# Patient Record
Sex: Female | Born: 1940 | Race: Black or African American | Hispanic: No | Marital: Married | Smoking: Never smoker
Health system: Southern US, Community
[De-identification: ages and names within clinical notes are randomized; demographics above are authoritative.]

## PROBLEM LIST (undated history)

## (undated) DIAGNOSIS — I639 Cerebral infarction, unspecified: Secondary | ICD-10-CM

## (undated) DIAGNOSIS — I4891 Unspecified atrial fibrillation: Secondary | ICD-10-CM

---

## 2005-05-05 ENCOUNTER — Other Ambulatory Visit: Admission: RE | Admit: 2005-05-05 | Discharge: 2005-05-05 | Payer: Self-pay | Admitting: Family Medicine

## 2014-09-10 DIAGNOSIS — Z961 Presence of intraocular lens: Secondary | ICD-10-CM | POA: Diagnosis not present

## 2018-07-31 ENCOUNTER — Ambulatory Visit (HOSPITAL_COMMUNITY)
Admission: EM | Admit: 2018-07-31 | Discharge: 2018-07-31 | Disposition: A | Discharge status: Home / Self Care | Payer: Medicare Other | Attending: Urgent Care | Admitting: Urgent Care

## 2018-07-31 ENCOUNTER — Ambulatory Visit (INDEPENDENT_AMBULATORY_CARE_PROVIDER_SITE_OTHER): Payer: Medicare Other

## 2018-07-31 ENCOUNTER — Other Ambulatory Visit: Payer: Self-pay

## 2018-07-31 ENCOUNTER — Encounter (HOSPITAL_COMMUNITY): Payer: Self-pay | Admitting: *Deleted

## 2018-07-31 DIAGNOSIS — M79651 Pain in right thigh: Secondary | ICD-10-CM

## 2018-07-31 DIAGNOSIS — M79604 Pain in right leg: Secondary | ICD-10-CM | POA: Diagnosis not present

## 2018-07-31 DIAGNOSIS — M545 Low back pain, unspecified: Secondary | ICD-10-CM

## 2018-07-31 DIAGNOSIS — R030 Elevated blood-pressure reading, without diagnosis of hypertension: Secondary | ICD-10-CM

## 2018-07-31 DIAGNOSIS — D259 Leiomyoma of uterus, unspecified: Secondary | ICD-10-CM

## 2018-07-31 DIAGNOSIS — M4317 Spondylolisthesis, lumbosacral region: Secondary | ICD-10-CM

## 2018-07-31 DIAGNOSIS — M5136 Other intervertebral disc degeneration, lumbar region: Secondary | ICD-10-CM

## 2018-07-31 MED ORDER — VALACYCLOVIR HCL 1 G PO TABS: 1000.0000 mg | ORAL_TABLET | Freq: Three times a day (TID) | ORAL | 0 refills | Status: DC

## 2018-07-31 NOTE — ED Provider Notes (Signed)
MRN: [id number] DOB: Jun 08, 1940  Subjective:   Lydia Garcia is a 78 y.o. female presenting for 2-day history of moderate to severe sharp constant right thigh pain, numbness and tingling about her right hip and mild intermittent low back pain that goes to her right flank side.  Patient has a history of back issues and is working with PT and on orthopedist for this.  She states that she is gotten much better over the past year.  She has not tried any medications for relief.  Denies any recent falls, fever, nausea, vomiting, belly pain, blood in her urine, heavy lifting.  Denies any rash.  Denies ever having shingles. She does not take chronic medications.  Denies history of hypertension.  Reports that her blood pressure is elevated because of her thigh pain.  Tries to hydrate very well.  No Known Allergies  ROS  Objective:   Vitals: BP (!) 170/78   Pulse 77   Temp 98.3 F (36.8 C) (Oral)   Resp 16   SpO2 97%   Physical Exam Constitutional:      General: She is not in acute distress.    Appearance: Normal appearance. She is well-developed. She is not ill-appearing.  HENT:     Head: Normocephalic and atraumatic.     Nose: Nose normal.     Mouth/Throat:     Mouth: Mucous membranes are moist.     Pharynx: Oropharynx is clear.  Eyes:     General: No scleral icterus.    Extraocular Movements: Extraocular movements intact.     Pupils: Pupils are equal, round, and reactive to light.  Cardiovascular:     Rate and Rhythm: Normal rate.  Pulmonary:     Effort: Pulmonary effort is normal.  Musculoskeletal:     Lumbar back: She exhibits decreased range of motion. She exhibits no tenderness, no bony tenderness, no swelling, no edema, no deformity and no spasm.     Right upper leg: She exhibits tenderness (With light and deep palpation about her mid anterior lateral thigh). She exhibits no bony tenderness, no swelling, no edema, no deformity and no laceration.  Skin:    General: Skin is  warm and dry.     Findings: No rash.  Neurological:     General: No focal deficit present.     Mental Status: She is alert and oriented to person, place, and time.  Psychiatric:        Mood and Affect: Mood normal.        Behavior: Behavior normal.    Dg Lumbar Spine Complete  Result Date: 07/31/2018 CLINICAL DATA:  Two-day history of low back pain radiating into the RIGHT thigh making it difficult to bear weight with the RIGHT leg. EXAM: LUMBAR SPINE - COMPLETE 4+ VIEW COMPARISON:  None. FINDINGS: 5 non-rib-bearing lumbar vertebrae. Severe facet degenerative changes at L3-4, L4-5 and L5-S1, resulting in grade 1 spondylolisthesis of L5 on S1 measuring approximately 8 mm. Disc spaces well-preserved throughout. Sacroiliac joints intact with only mild degenerative changes. Note is made of numerous calcified, degenerated uterine fibroids in the pelvis. IMPRESSION: 1. Severe facet degenerative changes at L3-4, L4-5 and L5-S1, resulting in grade 1 spondylolisthesis of L5 on S1 measuring approximately 8 mm. 2. Well-preserved disc spaces throughout the lumbar spine. Electronically Signed   By: Evangeline Dakin M.D.   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  I counseled the she should establish care with an orthopedist.  She does not want to take medications with heavy side effects.  Therefore we will have her schedule Tylenol and stop ibuprofen.  She is to start Valtrex if she develops a rash consistent with shingles to address possible pre-herpetic neuralgia.  Recommended conservative management with her back, continued adequate daily hydration. Counseled patient on  potential for adverse effects with medications prescribed today, patient verbalized understanding. ER and return-to-clinic precautions discussed, patient verbalized understanding.    Jaynee Eagles, PA-C 07/31/18 1323

## 2018-07-31 NOTE — ED Triage Notes (Signed)
C/O waking 2 days ago with right anterior thigh pain and slight numbness in right hip, and slight twinge of pain in right lower back.  Denies any injury.

## 2018-07-31 NOTE — Discharge Instructions (Addendum)
You may take 500mg -650mg  Tylenol every 6 hours for pain and inflammation of your back and thigh.

## 2018-07-31 NOTE — ED Notes (Signed)
Patient verbalizes understanding of discharge instructions. Opportunity for questioning and answers were provided. Patient discharged from [facility] by provider.  

## 2020-01-10 IMAGING — DX LUMBAR SPINE - COMPLETE 4+ VIEW
5 series · 5 of 5 positions shown · non-contrast
Comparison: None.

CLINICAL DATA: Two-day history of low back pain radiating into the
RIGHT thigh making it difficult to bear weight with the RIGHT leg.

EXAM:
LUMBAR SPINE - COMPLETE 4+ VIEW

[l-spine ap]
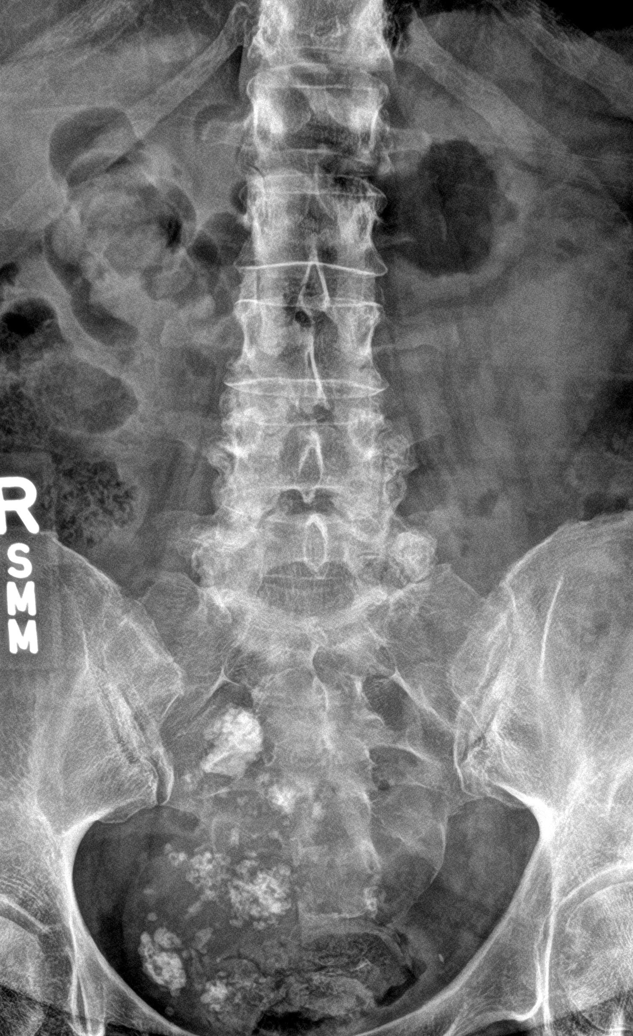

[l-spine obl (1 of 2)]
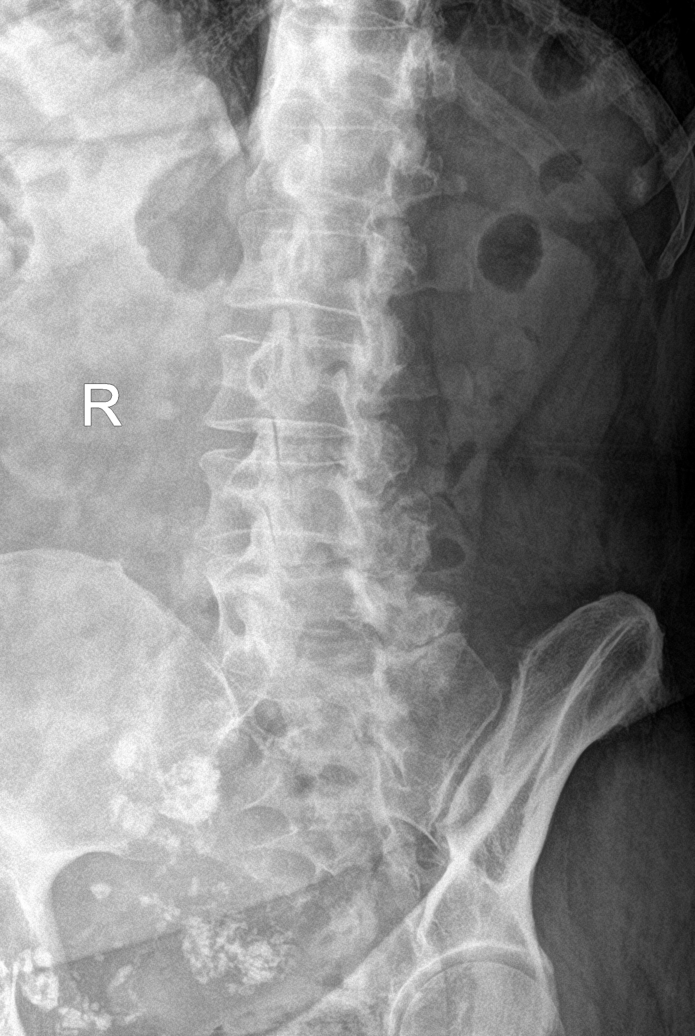

[l-spine obl (2 of 2)]
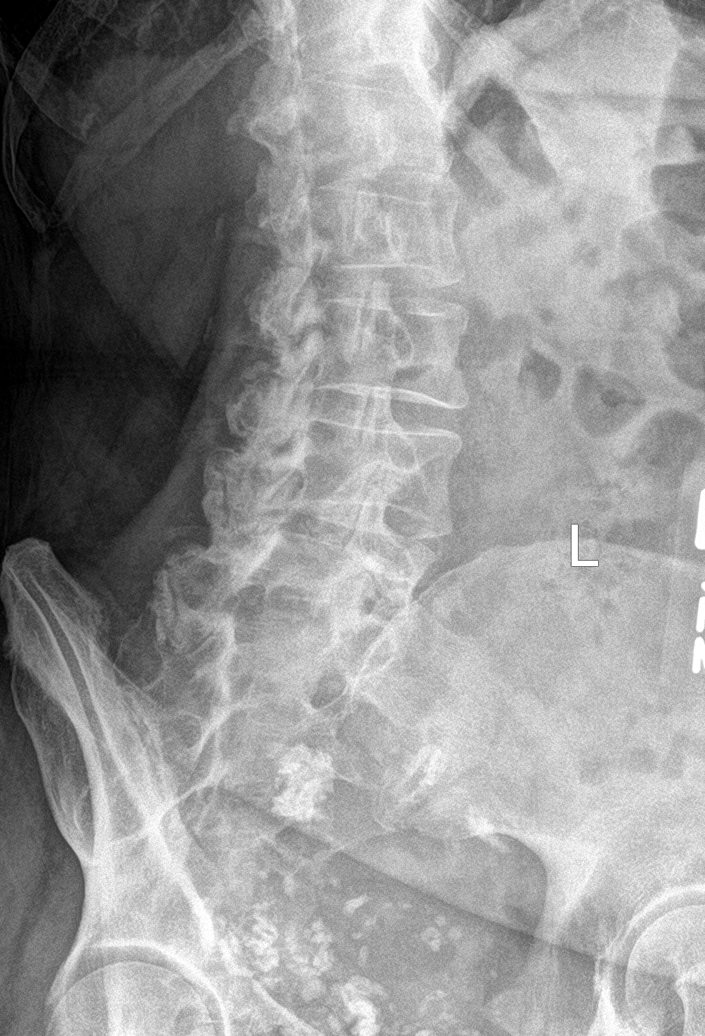

[l-spine lat]
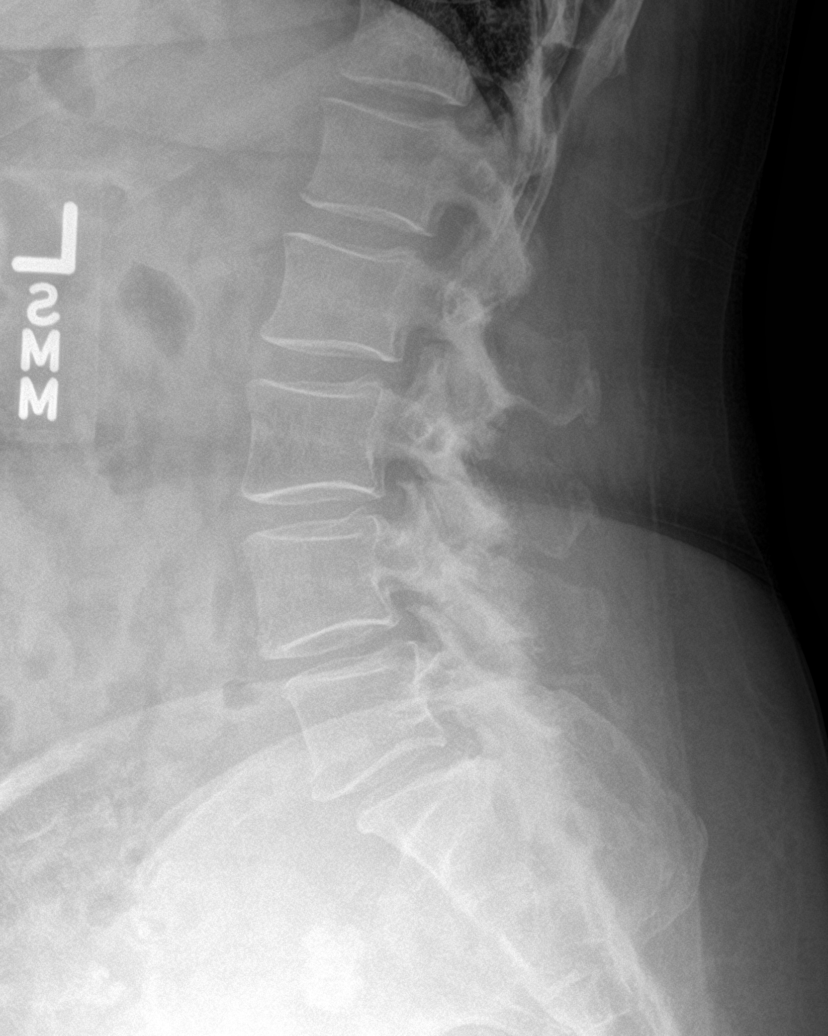

[l-spine spot]
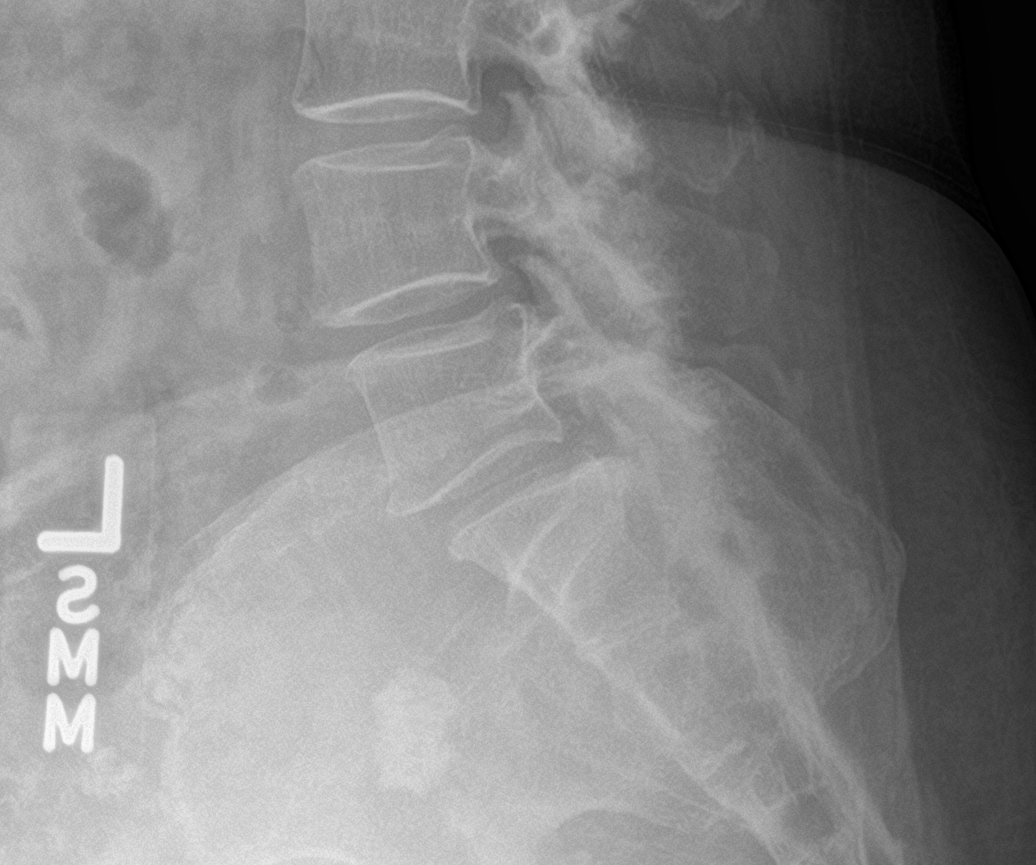

[5 of 5 positions shown; findings below may reference images not displayed]

FINDINGS: 5 non-rib-bearing lumbar vertebrae. Severe facet degenerative
changes at L3-4, L4-5 and L5-S1, resulting in grade 1
spondylolisthesis of L5 on S1 measuring approximately 8 mm. Disc
spaces well-preserved throughout. Sacroiliac joints intact with only
mild degenerative changes.

Note is made of numerous calcified, degenerated uterine fibroids in
the pelvis.
IMPRESSION: 1. Severe facet degenerative changes at L3-4, L4-5 and L5-S1,
resulting in grade 1 spondylolisthesis of L5 on S1 measuring
approximately 8 mm.
2. Well-preserved disc spaces throughout the lumbar spine.

## 2020-10-02 ENCOUNTER — Ambulatory Visit (HOSPITAL_COMMUNITY)
Admission: EM | Admit: 2020-10-02 | Discharge: 2020-10-02 | Disposition: A | Discharge status: Home / Self Care | Payer: Medicare PPO | Attending: Urgent Care | Admitting: Urgent Care

## 2020-10-02 ENCOUNTER — Other Ambulatory Visit: Payer: Self-pay

## 2020-10-02 ENCOUNTER — Ambulatory Visit (INDEPENDENT_AMBULATORY_CARE_PROVIDER_SITE_OTHER): Payer: Medicare PPO

## 2020-10-02 ENCOUNTER — Encounter (HOSPITAL_COMMUNITY): Payer: Self-pay | Admitting: Emergency Medicine

## 2020-10-02 ENCOUNTER — Ambulatory Visit (HOSPITAL_COMMUNITY): Payer: Medicare PPO

## 2020-10-02 DIAGNOSIS — M25572 Pain in left ankle and joints of left foot: Secondary | ICD-10-CM

## 2020-10-02 DIAGNOSIS — S96912A Strain of unspecified muscle and tendon at ankle and foot level, left foot, initial encounter: Secondary | ICD-10-CM

## 2020-10-02 NOTE — ED Triage Notes (Signed)
Pt is present today with right ankle pain. Pt states that after she stepped out the tub this morning she noticed a pain with her ankle. Pt states that she cannot apply any pressure to her ankle with out discomfort.

## 2020-10-02 NOTE — ED Provider Notes (Signed)
Lydia Garcia   MRN: [id number] DOB: 1941-02-04  Subjective:   Lydia Garcia is a 80 y.o. female presenting for left ankle pain since this morning.  Patient states that symptoms started after she stepped out of the tub.  Has had progressively worsening swelling, pain and difficulty bearing weight.  Patient states that she has been able to walk but with significant difficulty.  Denies any particular trauma, history of ankle issues, warmth, erythema, history of gout.  No current facility-administered medications for this encounter.  Current Outpatient Medications:    valACYclovir (VALTREX) 1000 MG tablet, Take 1 tablet (1,000 mg total) by mouth 3 (three) times daily., Disp: 30 tablet, Rfl: 0   No Known Allergies  History reviewed. No pertinent past medical history.   History reviewed. No pertinent surgical history.  Family History  Problem Relation Age of Onset   Healthy Mother    Healthy Father    Heart attack Daughter     Social History   Tobacco Use   Smoking status: Never   Smokeless tobacco: Never  Vaping Use   Vaping Use: Never used  Substance Use Topics   Alcohol use: Never   Drug use: Never    ROS   Objective:   Vitals: BP (!) 158/79 (BP Location: Left Arm)   Pulse 81   Temp 98.9 F (37.2 C)   Resp 18   SpO2 100%   Physical Exam Constitutional:      General: She is not in acute distress.    Appearance: Normal appearance. She is well-developed. She is not ill-appearing, toxic-appearing or diaphoretic.  HENT:     Head: Normocephalic and atraumatic.     Nose: Nose normal.     Mouth/Throat:     Mouth: Mucous membranes are moist.     Pharynx: Oropharynx is clear.  Eyes:     General: No scleral icterus.       Right eye: No discharge.        Left eye: No discharge.     Extraocular Movements: Extraocular movements intact.     Conjunctiva/sclera: Conjunctivae normal.     Pupils: Pupils are equal, round, and reactive to light.   Cardiovascular:     Rate and Rhythm: Normal rate.  Pulmonary:     Effort: Pulmonary effort is normal.  Musculoskeletal:     Left ankle: Swelling present. No deformity, ecchymosis or lacerations. Tenderness present over the lateral malleolus and AITF ligament. No medial malleolus, ATF ligament, CF ligament, posterior TF ligament, base of 5th metatarsal or proximal fibula tenderness. Normal range of motion.     Left Achilles Tendon: No tenderness or defects. Thompson's test negative.  Skin:    General: Skin is warm and dry.  Neurological:     General: No focal deficit present.     Mental Status: She is alert and oriented to person, place, and time.     Motor: No weakness.     Coordination: Coordination normal.     Gait: Gait normal.     Deep Tendon Reflexes: Reflexes normal.  Psychiatric:        Mood and Affect: Mood normal.        Behavior: Behavior normal.        Thought Content: Thought content normal.        Judgment: Judgment normal.    DG Ankle Complete Left  Result Date: 10/02/2020 CLINICAL DATA:  Pain and swelling EXAM: LEFT ANKLE COMPLETE - 3+ VIEW COMPARISON:  None.  FINDINGS: Frontal, oblique, and lateral views were obtained. There is soft tissue swelling. No appreciable fracture or joint effusion. There is mild narrowing in the posterior aspect of the ankle joint. There are small posterior and inferior calcaneal spurs. No erosive change. IMPRESSION: Soft tissue swelling. Mild osteoarthritic change. Small calcaneal spurs. No evident fracture. Ankle mortise appears intact. Electronically Signed   By: Lowella Grip III M.D.   On: 10/02/2020 11:02     Assessment and Plan :   PDMP not reviewed this encounter.  1. Acute left ankle pain   2. Left ankle strain, initial encounter     We will manage conservatively for an ankle strain with rice method, Tylenol, 3" Ace wrap applied to the left ankle.  Patient declined crutches. Counseled patient on potential for adverse effects  with medications prescribed/recommended today, ER and return-to-clinic precautions discussed, patient verbalized understanding.    Jaynee Eagles, PA-C 10/02/20 1125

## 2022-03-14 IMAGING — DX DG ANKLE COMPLETE 3+V*L*
3 series · 3 of 3 positions shown · non-contrast
Comparison: None.

CLINICAL DATA: Pain and swelling

EXAM:
LEFT ANKLE COMPLETE - 3+ VIEW

[ankle ap]
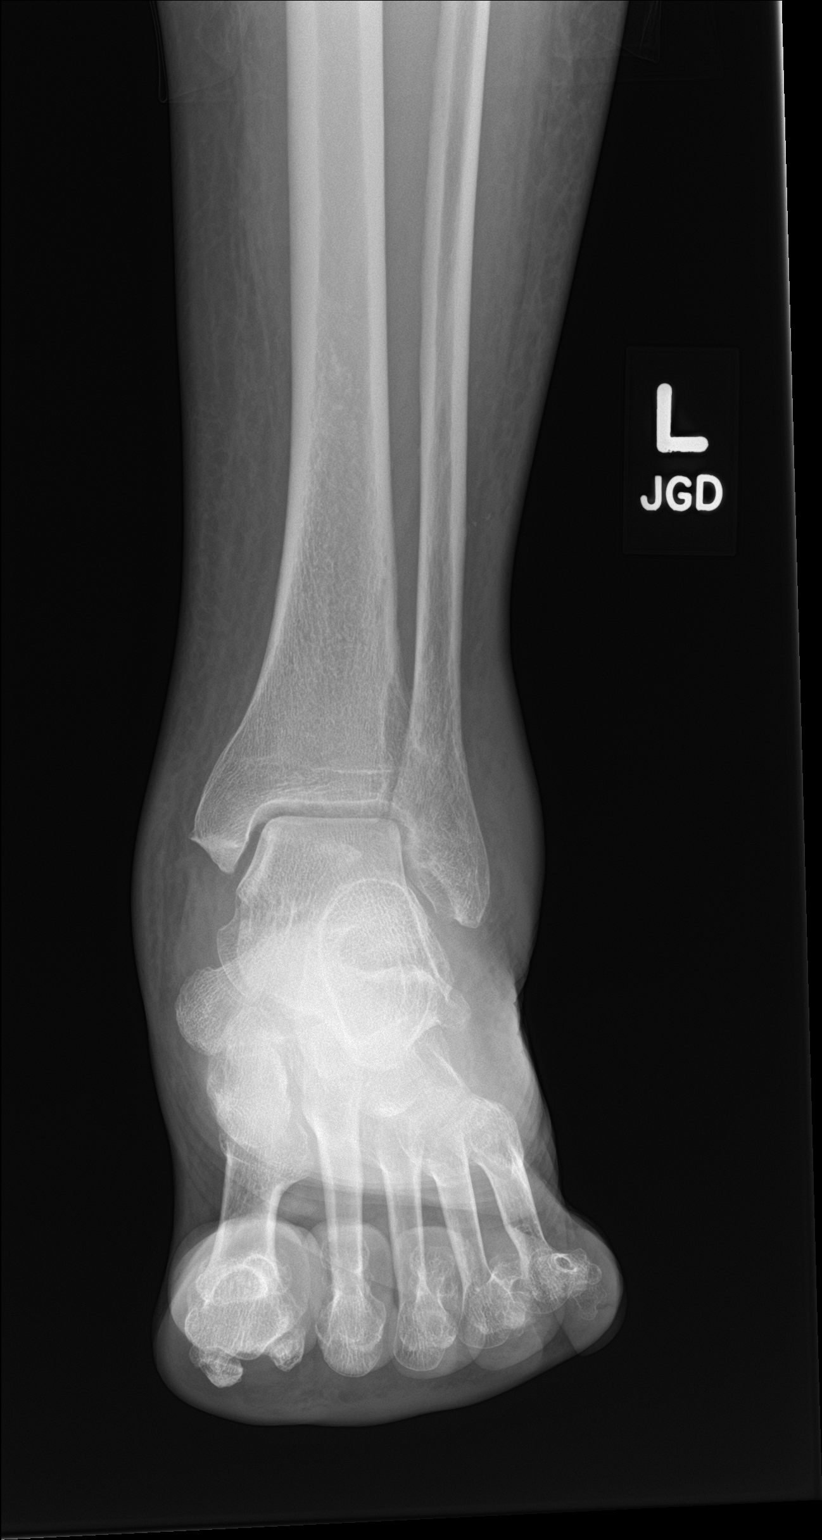

[ankle obl]
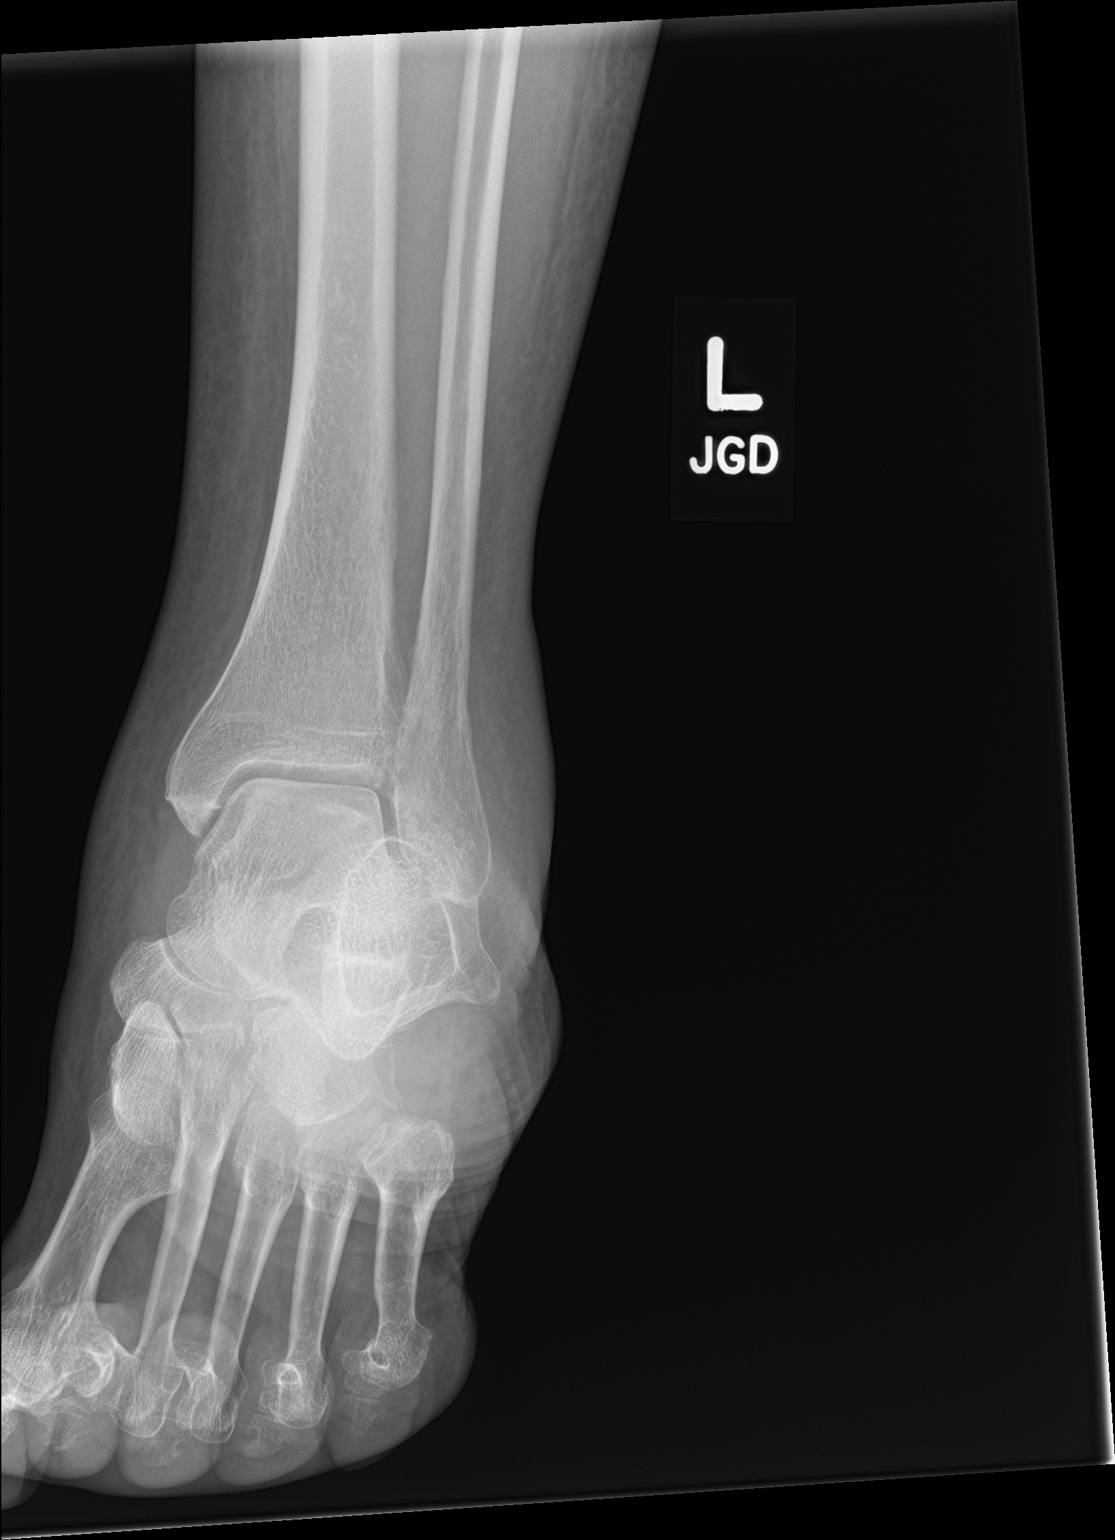

[ankle lat]
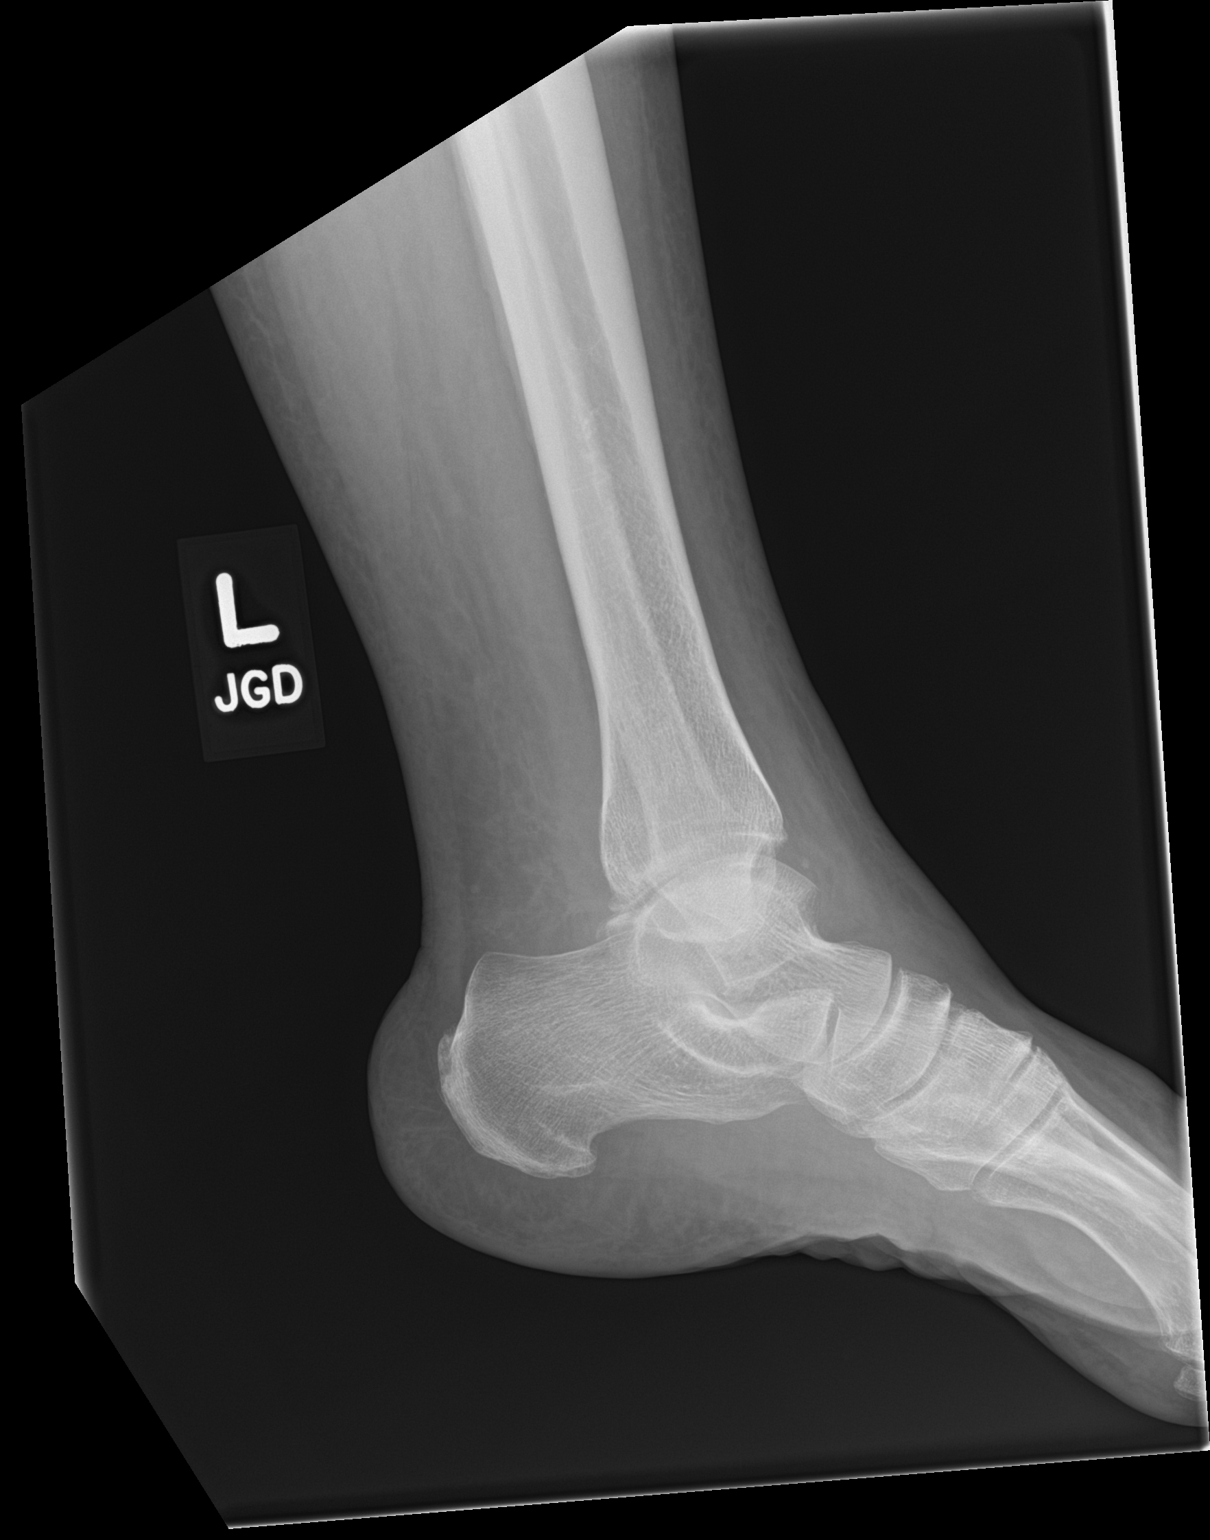

[3 of 3 positions shown; findings below may reference images not displayed]

FINDINGS: Frontal, oblique, and lateral views were obtained. There is soft
tissue swelling. No appreciable fracture or joint effusion. There is
mild narrowing in the posterior aspect of the ankle joint. There are
small posterior and inferior calcaneal spurs. No erosive change.
IMPRESSION: Soft tissue swelling. Mild osteoarthritic change. Small calcaneal
spurs. No evident fracture. Ankle mortise appears intact.

## 2022-05-05 ENCOUNTER — Ambulatory Visit: Payer: Medicare PPO | Admitting: Podiatry

## 2022-05-12 ENCOUNTER — Ambulatory Visit: Payer: Medicare PPO | Admitting: Podiatry

## 2022-05-12 ENCOUNTER — Encounter: Payer: Self-pay | Admitting: Podiatry

## 2022-05-12 DIAGNOSIS — M2042 Other hammer toe(s) (acquired), left foot: Secondary | ICD-10-CM

## 2022-05-12 NOTE — Progress Notes (Signed)
  Subjective:  Patient ID: Lydia Garcia, female    DOB: 1940-08-24,  MRN: [id number]  Chief Complaint  Patient presents with   Toe Pain    (np) 3rd toe, tip is sore, painful callus    82 y.o. female presents with the above complaint. History confirmed with patient.   Objective:  Physical Exam: warm, good capillary refill, no trophic changes or ulcerative lesions, normal DP and PT pulses, normal sensory exam, and painful callus tip of third toe with hammertoe contracture semireducible.  Assessment:   1. Hammertoe of left foot      Plan:  Patient was evaluated and treated and all questions answered.  We discussed ongoing clinical exam findings of a hammertoe and how this contributes to the formation of the painful hyperkeratotic lesion.  We discussed surgical nonsurgical treatment.  I debrided the lesion as a courtesy today.  Offloading silicone pads were dispensed.  She will return if it does not improve for surgical consultation.  Return if symptoms worsen or fail to improve.

## 2022-05-12 NOTE — Patient Instructions (Signed)
More silicone pads can be purchased from:  [email or web address]   Or you can come by the office to purchase more

## 2022-07-31 ENCOUNTER — Other Ambulatory Visit: Payer: Self-pay | Admitting: Registered Nurse

## 2022-07-31 DIAGNOSIS — E2839 Other primary ovarian failure: Secondary | ICD-10-CM

## 2023-02-09 ENCOUNTER — Ambulatory Visit
Admission: RE | Admit: 2023-02-09 | Discharge: 2023-02-09 | Disposition: A | Discharge status: Home / Self Care | Payer: Medicare PPO | Source: Ambulatory Visit | Attending: Registered Nurse

## 2023-02-09 DIAGNOSIS — E2839 Other primary ovarian failure: Secondary | ICD-10-CM

## 2023-03-30 ENCOUNTER — Ambulatory Visit: Payer: Medicare PPO | Admitting: Podiatry

## 2023-03-30 DIAGNOSIS — M2042 Other hammer toe(s) (acquired), left foot: Secondary | ICD-10-CM

## 2023-03-30 NOTE — Progress Notes (Signed)
  Subjective:  Patient ID: Lydia Garcia, female    DOB: 1940/07/10,  MRN: [id number]  Chief Complaint  Patient presents with   Toe Pain    RM2: left toe has place on tip of toe causing pain     82 y.o. female presents with the above complaint. History confirmed with patient.   Objective:  Physical Exam: warm, good capillary refill, no trophic changes or ulcerative lesions, normal DP and PT pulses, normal sensory exam, and painful callus tip of third toe with hammertoe contracture semireducible.  Assessment:   1. Hammertoe of left foot      Plan:  Patient was evaluated and treated and all questions answered.  Had improved after last debridement and silicone padding, she will continue this for the next month and then follow-up with me next month for a flexor tenotomy we discussed the risks and benefits of this as well as the recovery process.  She will see me in 1 month for the procedure.  Return in about 1 month (around 04/30/2023) for L 3rd toe flexor tenotomy .

## 2023-05-03 ENCOUNTER — Ambulatory Visit: Payer: Medicare PPO | Admitting: Podiatry

## 2023-05-06 ENCOUNTER — Ambulatory Visit: Payer: Medicare PPO | Admitting: Podiatry

## 2023-05-06 ENCOUNTER — Encounter: Payer: Self-pay | Admitting: Podiatry

## 2023-05-06 DIAGNOSIS — M2042 Other hammer toe(s) (acquired), left foot: Secondary | ICD-10-CM | POA: Diagnosis not present

## 2023-05-06 NOTE — Progress Notes (Signed)
Patient presents today for treatment of left third toe contracture with flexor tenotomy.  Prior to the procedure we discussed the risk benefits and potential complications as well as the recovery and postoperative care.  Informed consent signed and reviewed.  Following this the left third toe was anesthetized with 1.5 cc each of 2% lidocaine and 0.5% Marcaine plain.  She tolerated this well it was prepped with Betadine and a Coban bandage was used to exsanguinate the digit and a tourniquet secured on the base of the toe.  A #11 blade was used to percutaneously transect the long flexor tendon and the digit was dorsiflexed to reduce the contracture.  The tourniquet was released and a postoperative bandage was applied with Silvadene Telfa dry gauze and Coban compression with the toe splinted in a rectus position.  She tolerated the procedure well.  I will see her back in 4 weeks to reevaluate.

## 2023-05-06 NOTE — Patient Instructions (Signed)
Leave the bandage on for 24 hours.  After 24 hours you may remove the bandage and take a shower, after this apply Neosporin and a small Band-Aid to cover the incision on the bottom of the toe.  Dress it daily with a Band-Aid and keep it covered with Neosporin and a Band-Aid for 2 weeks.  After that leave it open to air and let it heal.

## 2023-06-03 ENCOUNTER — Ambulatory Visit (INDEPENDENT_AMBULATORY_CARE_PROVIDER_SITE_OTHER): Payer: Medicare PPO | Admitting: Podiatry

## 2023-06-03 ENCOUNTER — Other Ambulatory Visit: Payer: Self-pay | Admitting: Podiatry

## 2023-06-03 ENCOUNTER — Ambulatory Visit (INDEPENDENT_AMBULATORY_CARE_PROVIDER_SITE_OTHER): Payer: Medicare PPO

## 2023-06-03 ENCOUNTER — Encounter: Payer: Self-pay | Admitting: Podiatry

## 2023-06-03 DIAGNOSIS — M2042 Other hammer toe(s) (acquired), left foot: Secondary | ICD-10-CM

## 2023-06-03 NOTE — Progress Notes (Signed)
  Subjective:  Patient ID: Lydia Garcia, female    DOB: 12-26-1940,  MRN: [id number]  Chief Complaint  Patient presents with   Hammer Toe    Left foot hammer toe Pt stated that she is doing well denies pain at this time     DOS: 05/06/2023 Procedure: Left third toe flexor tenotomy  83 y.o. female returns for post-op check.  She is doing well she had little pain  Review of Systems: Negative except as noted in the HPI. Denies N/V/F/Ch.   Objective:  There were no vitals filed for this visit. There is no height or weight on file to calculate BMI. Constitutional Well developed. Well nourished.  Vascular Foot warm and well perfused. Capillary refill normal to all digits.  Calf is soft and supple, no posterior calf or knee pain, negative Homans' sign  Neurologic Normal speech. Oriented to person, place, and time. Epicritic sensation to light touch grossly present bilaterally.  Dermatologic Plantar incision third toe well-healed  Orthopedic: She has no pain to palpation noted about the surgical site.   Multiple view plain film radiographs: Improved position of third toe contracture Assessment:   1. Hammertoe of left foot    Plan:  Patient was evaluated and treated and all questions answered.  S/p foot surgery left -Progressing as expected post-operatively.  Normal shoes and activity as tolerated.  She does still have rigid contracture of the second toe with dorsal corn formation we discussed that if this returns or worsens that arthroplasty would be indicated and she will return to see me as needed for this but so far her primary issue of the third toe distal tip callus has improved quite a bit   Return if symptoms worsen or fail to improve.

## 2023-06-11 ENCOUNTER — Emergency Department (HOSPITAL_COMMUNITY): Payer: Medicare PPO

## 2023-06-11 ENCOUNTER — Observation Stay (HOSPITAL_COMMUNITY)
Admission: EM | Admit: 2023-06-11 | Discharge: 2023-06-12 | Disposition: A | Discharge status: Home / Self Care | Payer: Medicare PPO | Attending: Internal Medicine | Admitting: Internal Medicine

## 2023-06-11 ENCOUNTER — Encounter (HOSPITAL_COMMUNITY): Payer: Self-pay

## 2023-06-11 ENCOUNTER — Other Ambulatory Visit: Payer: Self-pay

## 2023-06-11 DIAGNOSIS — I4891 Unspecified atrial fibrillation: Secondary | ICD-10-CM

## 2023-06-11 DIAGNOSIS — R9431 Abnormal electrocardiogram [ECG] [EKG]: Secondary | ICD-10-CM

## 2023-06-11 DIAGNOSIS — I639 Cerebral infarction, unspecified: Secondary | ICD-10-CM | POA: Diagnosis not present

## 2023-06-11 DIAGNOSIS — Z7901 Long term (current) use of anticoagulants: Secondary | ICD-10-CM | POA: Insufficient documentation

## 2023-06-11 DIAGNOSIS — R7401 Elevation of levels of liver transaminase levels: Secondary | ICD-10-CM | POA: Insufficient documentation

## 2023-06-11 DIAGNOSIS — Z131 Encounter for screening for diabetes mellitus: Secondary | ICD-10-CM | POA: Diagnosis not present

## 2023-06-11 DIAGNOSIS — I6389 Other cerebral infarction: Principal | ICD-10-CM | POA: Insufficient documentation

## 2023-06-11 DIAGNOSIS — R4701 Aphasia: Secondary | ICD-10-CM | POA: Insufficient documentation

## 2023-06-11 DIAGNOSIS — G459 Transient cerebral ischemic attack, unspecified: Secondary | ICD-10-CM | POA: Diagnosis not present

## 2023-06-11 DIAGNOSIS — R4182 Altered mental status, unspecified: Secondary | ICD-10-CM | POA: Diagnosis present

## 2023-06-11 DIAGNOSIS — I4589 Other specified conduction disorders: Secondary | ICD-10-CM | POA: Diagnosis not present

## 2023-06-11 DIAGNOSIS — R471 Dysarthria and anarthria: Secondary | ICD-10-CM | POA: Diagnosis not present

## 2023-06-11 DIAGNOSIS — I480 Paroxysmal atrial fibrillation: Secondary | ICD-10-CM | POA: Insufficient documentation

## 2023-06-11 LAB — COMPREHENSIVE METABOLIC PANEL
ALT: 45 U/L — ABNORMAL HIGH (ref 0–44)
AST: 43 U/L — ABNORMAL HIGH (ref 15–41)
Albumin: 4 g/dL (ref 3.5–5.0)
Alkaline Phosphatase: 76 U/L (ref 38–126)
Anion gap: 13 (ref 5–15)
BUN: 17 mg/dL (ref 8–23)
CO2: 20 mmol/L — ABNORMAL LOW (ref 22–32)
Calcium: 9.1 mg/dL (ref 8.9–10.3)
Chloride: 106 mmol/L (ref 98–111)
Creatinine, Ser: 1.01 mg/dL — ABNORMAL HIGH (ref 0.44–1.00)
GFR, Estimated: 55 mL/min — ABNORMAL LOW (ref >60)
Glucose, Bld: 111 mg/dL — ABNORMAL HIGH (ref 70–99)
Potassium: 3.6 mmol/L (ref 3.5–5.1)
Sodium: 139 mmol/L (ref 135–145)
Total Bilirubin: 1.2 mg/dL (ref 0.0–1.2)
Total Protein: 6.7 g/dL (ref 6.5–8.1)

## 2023-06-11 LAB — RAPID URINE DRUG SCREEN, HOSP PERFORMED
Amphetamines: NOT DETECTED
Barbiturates: NOT DETECTED
Benzodiazepines: NOT DETECTED
Cocaine: NOT DETECTED
Opiates: NOT DETECTED
Tetrahydrocannabinol: NOT DETECTED

## 2023-06-11 LAB — DIFFERENTIAL
Abs Immature Granulocytes: 0.04 10*3/uL (ref 0.00–0.07)
Basophils Absolute: 0 10*3/uL (ref 0.0–0.1)
Basophils Relative: 1 %
Eosinophils Absolute: 0 10*3/uL (ref 0.0–0.5)
Eosinophils Relative: 1 %
Immature Granulocytes: 1 %
Lymphocytes Relative: 34 %
Lymphs Abs: 1.3 10*3/uL (ref 0.7–4.0)
Monocytes Absolute: 0.4 10*3/uL (ref 0.1–1.0)
Monocytes Relative: 11 %
Neutro Abs: 2.1 10*3/uL (ref 1.7–7.7)
Neutrophils Relative %: 52 %

## 2023-06-11 LAB — I-STAT CHEM 8, ED
BUN: 18 mg/dL (ref 8–23)
Calcium, Ion: 1.16 mmol/L (ref 1.15–1.40)
Chloride: 108 mmol/L (ref 98–111)
Creatinine, Ser: 1 mg/dL (ref 0.44–1.00)
Glucose, Bld: 109 mg/dL — ABNORMAL HIGH (ref 70–99)
HCT: 40 % (ref 36.0–46.0)
Hemoglobin: 13.6 g/dL (ref 12.0–15.0)
Potassium: 3.6 mmol/L (ref 3.5–5.1)
Sodium: 141 mmol/L (ref 135–145)
TCO2: 22 mmol/L (ref 22–32)

## 2023-06-11 LAB — URINALYSIS, ROUTINE W REFLEX MICROSCOPIC
Bilirubin Urine: NEGATIVE
Glucose, UA: NEGATIVE mg/dL
Hgb urine dipstick: NEGATIVE
Ketones, ur: NEGATIVE mg/dL
Leukocytes,Ua: NEGATIVE
Nitrite: NEGATIVE
Protein, ur: NEGATIVE mg/dL
Specific Gravity, Urine: >1.046 — ABNORMAL HIGH (ref 1.005–1.030)
pH: 5 (ref 5.0–8.0)

## 2023-06-11 LAB — CBC
HCT: 38.8 % (ref 36.0–46.0)
Hemoglobin: 13.3 g/dL (ref 12.0–15.0)
MCH: 32.7 pg (ref 26.0–34.0)
MCHC: 34.3 g/dL (ref 30.0–36.0)
MCV: 95.3 fL (ref 80.0–100.0)
Platelets: 245 10*3/uL (ref 150–400)
RBC: 4.07 MIL/uL (ref 3.87–5.11)
RDW: 13.5 % (ref 11.5–15.5)
WBC: 4 10*3/uL (ref 4.0–10.5)
nRBC: 0 % (ref 0.0–0.2)

## 2023-06-11 LAB — PROTIME-INR
INR: 1.1 (ref 0.8–1.2)
Prothrombin Time: 14.5 s (ref 11.4–15.2)

## 2023-06-11 LAB — HEMOGLOBIN A1C
Hgb A1c MFr Bld: 5.2 % (ref 4.8–5.6)
Mean Plasma Glucose: 102.54 mg/dL

## 2023-06-11 LAB — TSH: TSH: 1.672 u[IU]/mL (ref 0.350–4.500)

## 2023-06-11 LAB — ECHOCARDIOGRAM COMPLETE
Height: 63 in
S' Lateral: 2.6 cm
Weight: 2730.18 [oz_av]

## 2023-06-11 LAB — ETHANOL: Alcohol, Ethyl (B): <10 mg/dL (ref <10)

## 2023-06-11 LAB — MAGNESIUM: Magnesium: 1.8 mg/dL (ref 1.7–2.4)

## 2023-06-11 LAB — HEPATITIS C ANTIBODY: HCV Ab: NONREACTIVE

## 2023-06-11 LAB — APTT: aPTT: 34 s (ref 24–36)

## 2023-06-11 MED ORDER — ACETAMINOPHEN 650 MG RE SUPP: 650.0000 mg | Freq: Four times a day (QID) | RECTAL | Status: DC | PRN

## 2023-06-11 MED ORDER — METOPROLOL TARTRATE 5 MG/5ML IV SOLN: 5.0000 mg | Freq: Four times a day (QID) | INTRAVENOUS | Status: DC | PRN

## 2023-06-11 MED ORDER — IOHEXOL 350 MG/ML SOLN
100.0000 mL | Freq: Once | INTRAVENOUS | Status: AC | PRN
  Administered 2023-06-11: 100 mL via INTRAVENOUS

## 2023-06-11 MED ORDER — STROKE: EARLY STAGES OF RECOVERY BOOK
Freq: Once | Status: AC
  Filled 2023-06-11: qty 1

## 2023-06-11 MED ORDER — HEPARIN (PORCINE) 25000 UT/250ML-% IV SOLN
1000.0000 [IU]/h | INTRAVENOUS | Status: DC
  Administered 2023-06-11: 850 [IU]/h via INTRAVENOUS
  Filled 2023-06-11: qty 250

## 2023-06-11 MED ORDER — ACETAMINOPHEN 325 MG PO TABS: 650.0000 mg | ORAL_TABLET | Freq: Four times a day (QID) | ORAL | Status: DC | PRN

## 2023-06-11 NOTE — Assessment & Plan Note (Addendum)
She tells me she has had an irregular heart rate  her whole life Asymptomatic and rate controlled.  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

## 2023-06-11 NOTE — ED Triage Notes (Signed)
Patient BIB family for acute onset of dizziness, aphasia and dysarthria at 0900 this morning. Patient presents with dysarthria and aphasia, no dizziness reported.

## 2023-06-11 NOTE — Plan of Care (Signed)
  Problem: Education: Goal: Knowledge of disease or condition will improve Outcome: Progressing Goal: Knowledge of patient specific risk factors will improve (DELETE if not current risk factor) Outcome: Progressing   Problem: Ischemic Stroke/TIA Tissue Perfusion: Goal: Complications of ischemic stroke/TIA will be minimized Outcome: Progressing   Problem: Coping: Goal: Will identify appropriate support needs Outcome: Progressing   Problem: Nutrition: Goal: Risk of aspiration will decrease Outcome: Progressing   Problem: Education: Goal: Knowledge of General Education information will improve Description: Including pain rating scale, medication(s)/side effects and non-pharmacologic comfort measures Outcome: Progressing   Problem: Health Behavior/Discharge Planning: Goal: Ability to manage health-related needs will improve Outcome: Progressing   Problem: Clinical Measurements: Goal: Diagnostic test results will improve Outcome: Progressing

## 2023-06-11 NOTE — H&P (Signed)
History and Physical    Patient: Lydia Garcia WUJ:[id number] DOB: 02-28-1941 DOA: 06/11/2023 DOS: the patient was seen and examined on 06/11/2023 PCP: [name], NP  Patient coming from: Home - lives alone. Ambulates independently    Chief Complaint: aphasia and dizziness   HPI: Lydia Garcia is a 83 y.o. female with no significant medical history who presented with complaints of dizziness and aphasia that started around 9AM this morning. Her daughter in a SLP and was there and states she started to have aphasia. Her dizziness resolved, but her speech continued to be aphasic. She put her clothes on and went on with her normal routine, but family brought her to ED. At baseline she is completely independent with her ADLs. She does her own finances, dresses, cooks breakfast only.   LKW: 6:30pm.    He has been feeling good. Denies any fever/chills, vision changes/headaches, chest pain or palpitations, shortness of breath or cough, abdominal pain, N/V/D, dysuria or leg swelling.   She did have a cold recently with a cough, but recovered from this.   She does not smoke or drink alcohol.   ER Course:  vitals: afebrile, bp: 178/83, HR; 80, RR: 16, oxygen: 95%RA Pertinent labs: AST: 43, ALT: 45,  CT head: no acute finding CTA head/neck: no LVO. Distal M2 left MCA poorly delineated, suspicious for vessel occlusion. Non stenotic aterorsclerotic plaque within the intracranial internal carotid arteries.  In ED: code stroke initiated. Neurology consulted. Outside of tx window. No LVO.  EKG with new afib. TRH asked to admit.   Review of Systems: As mentioned in the history of present illness. All other systems reviewed and are negative. History reviewed. No pertinent past medical history. History reviewed. No pertinent surgical history. Social History:  reports that she has never smoked. She has never used smokeless tobacco. She reports that she does not drink alcohol and does not use  drugs.  No Known Allergies  Family History  Problem Relation Age of Onset   Healthy Mother    Healthy Father    Heart attack Daughter     Prior to Admission medications   Not on File    Physical Exam: Vitals:   06/11/23 1225 06/11/23 1228 06/11/23 1230 06/11/23 1330  BP:   (!) 163/87 (!) 165/93  Pulse: 86  86 74  Resp: 19  (!) 24 16  Temp:      TempSrc:      SpO2: 99%  100% 99%  Weight:      Height:  5\' 3"  (1.6 m)     General:  Appears calm and comfortable and is in NAD Eyes:  PERRL, EOMI, normal lids, iris ENT: HOH in right ear, lips & tongue, mmm; appropriate dentition Neck:  no LAD, masses or thyromegaly; no carotid bruits Cardiovascular:  RRR, no m/r/g. No LE edema.  Respiratory:   CTA bilaterally with no wheezes/rales/rhonchi.  Normal respiratory effort. Abdomen:  soft, NT, ND, NABS Back:   normal alignment, no CVAT Skin:  no rash or induration seen on limited exam Musculoskeletal:  grossly normal tone BUE/BLE, good ROM, no bony abnormality Lower extremity:  No LE edema.  Limited foot exam with no ulcerations.  2+ distal pulses. Psychiatric:  grossly normal mood and affect, speech fluent and appropriate but has some aphasic words at times, AOx3 Neurologic:  CN 2-12 grossly intact, moves all extremities in coordinated fashion, sensation intact. DTR 2+, HTK intact bilaterally. FTN intact bilaterally. Gait deferred.    Radiological Exams on  Admission: Independently reviewed - see discussion in A/P where applicable  MR BRAIN WO CONTRAST Result Date: 06/11/2023 CLINICAL DATA:  Neuro deficit, acute, stroke suspected. EXAM: MRI HEAD WITHOUT CONTRAST TECHNIQUE: Multiplanar, multiecho pulse sequences of the brain and surrounding structures were obtained without intravenous contrast. COMPARISON:  Head CT and CTA head/neck 06/11/2023. 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 No acute hemorrhage or mass effect. Focal susceptibility in the posterior aspect of the left sylvian fissure likely corresponds to the vessel occlusion seen on same day CTA. Hydrocephalus or extra-axial collection. No mass or midline shift. Vascular: As above.  Otherwise normal flow voids. Skull and upper cervical spine: Normal marrow signal. Sinuses/Orbits: No acute findings. Other: None. IMPRESSION: 1. 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 No acute hemorrhage or mass effect. 2. Focal susceptibility in the posterior aspect of the left Sylvian fissure likely corresponds to the vessel occlusion seen on same day CTA. Electronically Signed   By: Orvan Falconer M.D.   On: 06/11/2023 14:27   CT ANGIO HEAD NECK W WO CM W PERF (CODE STROKE) Result Date: 06/11/2023 CLINICAL DATA:  Provided history: Neuro deficit, acute, stroke suspected. Additional history obtained from electronic MEDICAL RECORD NUMBERDizziness, aphasia, dysarthria. EXAM: CT ANGIOGRAPHY HEAD AND NECK CT PERFUSION BRAIN TECHNIQUE: Multidetector CT imaging of the head and neck was performed using the standard protocol during bolus administration of intravenous contrast. Multiplanar CT image reconstructions and MIPs were obtained to evaluate the vascular anatomy. Carotid stenosis measurements (when applicable) are obtained utilizing NASCET criteria, using the distal internal carotid diameter as the denominator. Multiphase CT imaging of the brain was performed following IV bolus contrast injection. Subsequent parametric perfusion maps were calculated using RAPID software. RADIATION DOSE REDUCTION: This exam was performed according to the departmental dose-optimization program which includes automated exposure control, adjustment of the mA and/or kV according to patient size and/or use of iterative reconstruction technique. CONTRAST:  OMNIPAQUE  IOHEXOL 350 MG/ML SOLN COMPARISON:  Non-contrast head CT performed earlier today 06/11/2023. FINDINGS: CTA NECK FINDINGS Aortic arch: Standard aortic branching. Atherosclerotic plaque within the visualized thoracic aorta. Streak/beam hardening artifact arising from a dense contrast bolus partially obscures the left subclavian artery. Within this limitation, there is no appreciable hemodynamically significant innominate or proximal subclavian artery stenosis. Right carotid system: CCA and ICA patent within the neck without stenosis or significant atherosclerotic disease. Partially retropharyngeal course of the distal common carotid and proximal internal carotid arteries. Left carotid system: CCA and ICA patent within the neck without stenosis or significant atherosclerotic disease. Partially retropharyngeal course of the distal common carotid and proximal internal carotid arteries. Vertebral arteries: Codominant and patent within the neck. Venous reflux of contrast obscures portions of the left vertebral artery V1 segment. Within this limitation, no stenosis is identified within the cervical vertebral arteries. Skeleton: Nonspecific reversal of the expected cervical lordosis. Cervical spondylosis. No acute fracture or aggressive osseous lesion. Other neck: No neck mass or cervical lymphadenopathy. Upper chest: No consolidation within the imaged lung apices. Review of the MIP images confirms the above findings CTA HEAD FINDINGS Anterior circulation: The intracranial internal carotid arteries are patent. Non-stenotic atherosclerotic plaque within both vessels. No right M2 proximal branch occlusion or high-grade proximal arterial stenosis identified. A distal M2 left MCA vessel is poorly delineated (series 12, image 31). The anterior cerebral arteries are patent. No intracranial aneurysm  is identified. Posterior circulation: The intracranial vertebral arteries are patent. The basilar artery is patent. The posterior  cerebral arteries are patent. Posterior communicating arteries are present bilaterally. Venous sinuses: Within the limitations of contrast timing, no convincing thrombus. Anatomic variants: As described. Review of the MIP images confirms the above findings CT Brain Perfusion Findings: CBF (<30%) Volume: 0mL Perfusion (Tmax>6.0s) volume: 0mL Mismatch Volume: 0mL Infarction Location:None identified. CTA head impression #1 called by telephone at the time of interpretation on 06/11/2023 at 12:42 pm to provider [facility] , who verbally acknowledged these results. IMPRESSION: CTA neck: 1. Venous reflux of contrast obscures portions of the left vertebral artery V1 segment. Within this limitation, the common carotid, internal carotid and vertebral arteries are patent within the neck without stenosis or significant atherosclerotic disease. 2. Aortic Atherosclerosis (ICD10-I70.0). CTA head: 1. A distal M2 left middle cerebral artery vessel is poorly delineated, suspicious for vessel occlusion. 2. Non-stenotic atherosclerotic plaque within the intracranial internal carotid arteries. CT perfusion head: The perfusion software identifies no core infarct. The perfusion software identifies no critically hypoperfused parenchyma (utilizing the Tmax>6 seconds threshold). No mismatch volume reported. Electronically Signed   By: Jackey Loge D.O.   On: 06/11/2023 12:42   CT HEAD CODE STROKE WO CONTRAST Result Date: 06/11/2023 CLINICAL DATA:  Code stroke. Neuro deficit, acute, stroke suspected. EXAM: CT HEAD WITHOUT CONTRAST TECHNIQUE: Contiguous axial images were obtained from the base of the skull through the vertex without intravenous contrast. RADIATION DOSE REDUCTION: This exam was performed according to the departmental dose-optimization program which includes automated exposure control, adjustment of the mA and/or kV according to patient size and/or use of iterative reconstruction technique. COMPARISON:  None Available.  FINDINGS: Brain: No acute hemorrhage. Cortical gray-white differentiation is preserved. 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 Prominence of the ventricles and sulci within normal limits for age. No extra-axial collection. Basilar cisterns are patent. Vascular: No hyperdense vessel or unexpected calcification. Skull: No calvarial fracture or suspicious bone lesion. Skull base is unremarkable. Sinuses/Orbits: No acute finding. Other: None. ASPECTS ([place] Stroke Program Early CT Score) - Ganglionic level infarction (caudate, lentiform nuclei, internal capsule, insula, M1-M3 cortex): 7 - Supraganglionic infarction (M4-M6 cortex): 3 Total score (0-10 with 10 being normal): 10 IMPRESSION: No acute intracranial hemorrhage or evidence of acute large vessel territory infarct. ASPECT score is 10. Code stroke imaging results were communicated on 06/11/2023 at 12:10 pm to provider Dr. Wilford Corner via secure text paging. Electronically Signed   By: Orvan Falconer M.D.   On: 06/11/2023 12:11    EKG: Independently reviewed.  Atrial fib with rate 74; nonspecific ST changes with no evidence of acute ischemia Prolonged QT   Labs on Admission: I have personally reviewed the available labs and imaging studies at the time of the admission.  Pertinent labs:   AST: 43 ALT: 45   Assessment and Plan: Principal Problem:   Acute CVA (cerebrovascular accident) (HCC) Active Problems:   New onset atrial fibrillation (HCC)   Prolonged QT interval   Transaminitis    Assessment and Plan: * Acute CVA (cerebrovascular accident) (HCC) 83 year old presenting to ED with sudden onset of transient dizziness this AM around 9AM then aphasia found to have acute subcortical infarct along the left temporoparietal junction, likely involving the left optic radiations and arcuate fasciculus in setting of new onset atrial fibrillation  -obs to progressive   -Neurochecks per protocol -Neurology consulted -echo -A1C/statin  -start heparin  gtt in setting of new atrial fib. No hemorrhage on MRI. Anti-coag per stroke team  -Permissive hypertension first 24 hours <220/110 -N.p.o. until bedside swallow screen -PT/ OT/ SLP consult   New onset atrial fibrillation (HCC) She tells me she has had an irregular heart rate  her whole life Asymptomatic and rate controlled.  CHA2DS2-VA score of at least 4 Discussed anticoagulation with family and starting with heparin gtt, further recs per  neurology team  Check TSH/mag  Echo pending  Would start rate controlling drug, but will hold to allow for permissive HTN in acute CVA  Referral to afib clinic   Prolonged QT interval Optimize electrolytes Keep on telemetry Avoid qt prolonging drugs  Repeat ekg in AM    Transaminitis Mild. Recent viral illness Check hep C Trend     Advance Care Planning:   Code Status: Limited: Do not attempt resuscitation (DNR) -DNR-LIMITED -Do Not Intubate/DNI    Consults: neurology   DVT Prophylaxis: heparin gtt   Family Communication: daughter at bedside and her mother in law   Severity of Illness: The appropriate patient status for this patient is OBSERVATION. Observation status is judged to be reasonable and necessary in order to provide the required intensity of service to ensure the patient's safety. The patient's presenting symptoms, physical exam findings, and initial radiographic and laboratory data in the context of their medical condition is felt to place them at decreased risk for further clinical deterioration. Furthermore, it is anticipated that the patient will be medically stable for discharge from the hospital within 2 midnights of admission.   Author: Orland Mustard, MD 06/11/2023 2:55 PM  For on call review [email or web address].

## 2023-06-11 NOTE — Assessment & Plan Note (Signed)
 Optimize electrolytes Keep on telemetry Avoid qt prolonging drugs  Repeat ekg in AM

## 2023-06-11 NOTE — Consult Note (Addendum)
NEUROLOGY CONSULT NOTE   Date of service: June 11, 2023 Patient Name: Teniola Tseng MRN:  [id number] DOB:  Feb 06, 1941 Chief Complaint: "Dizziness, speech difficulty" Requesting Provider: Benjiman Core, MD  History of Present Illness  Bertice Risse is a 83 y.o. female with no significant past medical history presenting for evaluation of sudden onset of dizziness and speech difficulty.  According to the daughter, she spoke with the patient at 6:30 PM yesterday and she was fine but this morning upon waking up at 7 and starting to clean her house, she started feeling somewhat dizzy.  Her other daughter called her around 79 AM and figured that her speech was not normal.  Patient reports she was dizzy-describes as lightheadedness. She was brought in for evaluation of dizziness and speech difficulty.  Code stroke was activated in the triage. Upon confirming the timings with the family, with the best confirmation available the last known well that anybody spoke with her was 6:30 PM yesterday although the patient did mention that she woke up fine this morning but because of her mild aphasia, that information was unreliable. Stat CT head unremarkable.  Stat CTA head and neck and CT perfusion with no ELVO or perfusion deficit.  LKW: 1830, 06/10/2023 Modified rankin score: 0-Completely asymptomatic and back to baseline post- stroke IV Thrombolysis: OS W EVT: Low NIHSS  NIHSS components Score: Comment  1a Level of Conscious 0[x]  1[]  2[]  3[]      1b LOC Questions 0[x]  1[]  2[]       1c LOC Commands 0[x]  1[]  2[]       2 Best Gaze 0[x]  1[]  2[]       3 Visual 0[x]  1[]  2[]  3[]      4 Facial Palsy 0[x]  1[]  2[]  3[]      5a Motor Arm - left 0[x]  1[]  2[]  3[]  4[]  UN[]    5b Motor Arm - Right 0[x]  1[]  2[]  3[]  4[]  UN[]    6a Motor Leg - Left 0[x]  1[]  2[]  3[]  4[]  UN[]    6b Motor Leg - Right 0[x]  1[]  2[]  3[]  4[]  UN[]    7 Limb Ataxia 0[x]  1[]  2[]  3[]  UN[]     8 Sensory 0[x]  1[]  2[]  UN[]      9 Best Language 0[]   1[x]  2[]  3[]      10 Dysarthria 0[x]  1[]  2[]  UN[]      11 Extinct. and Inattention 0[x]  1[]  2[]       TOTAL: 1      ROS  Comprehensive ROS performed and pertinent positives documented in HPI   Past History  No past medical history on file.  No past surgical history on file.  Family History: Family History  Problem Relation Age of Onset   Healthy Mother    Healthy Father    Heart attack Daughter     Social History  reports that she has never smoked. She has never used smokeless tobacco. She reports that she does not drink alcohol and does not use drugs.  No Known Allergies  Medications  No current facility-administered medications for this encounter.  Current Outpatient Medications:    valACYclovir (VALTREX) 1000 MG tablet, Take 1 tablet (1,000 mg total) by mouth 3 (three) times daily., Disp: 30 tablet, Rfl: 0  Vitals   Vitals:   06/11/23 1138  BP: (!) 178/83  Pulse: 80  Resp: 16  Temp: 98.3 F (36.8 C)  TempSrc: Oral  SpO2: 95%  Weight: 77.4 kg    Physical Exam  General: Awake alert in no distress HEENT: Normocephalic atraumatic Lungs: Clear Cardiovascular: Regular  rhythm Abdomen nondistended nontender Neurological exam Awake alert oriented x 3 There is mild aphasia with paraphasic errors and word finding difficulty intermittently.  She also had mild naming difficulty on the stroke cards. Repetition intact. Able to follow commands Cranial nerves II to XII intact Motor examination with no drift Sensation intact light touch Coordination examination reveals no dysmetria  Labs/Imaging/Neurodiagnostic studies   CBC:  Recent Labs  Lab June 24, 2023 1148 24-Jun-2023 1153  WBC 4.0  --   NEUTROABS 2.1  --   HGB 13.3 13.6  HCT 38.8 40.0  MCV 95.3  --   PLT 245  --    Basic Metabolic Panel:  Lab Results  Component Value Date   NA 141 2023-06-24   K 3.6 06-24-2023   CO2 20 (L) 24-Jun-2023   GLUCOSE 109 (H) June 24, 2023   BUN 18 06-24-23   CREATININE 1.00  2023/06/24   CALCIUM 9.1 06-24-2023   GFRNONAA 55 (L) 24-Jun-2023   CT Head without contrast(Personally reviewed): Aspects 10.  No bleed  CT angio Head and Neck with contrast(Personally reviewed): Possible distal Lt M2 occlusion but CTP with no perfusion deficits.  ASSESSMENT   Raley Novicki is a 83 y.o. female presents with an episode of dizziness and speech difficulty.  On examination she does have mild aphasia.  Her EKG is being done but tele is concerning for atrial fibrillation. Given that the symptoms of speech difficulty and mild aphasia on examination is not her baseline according to the daughter who is a speech therapist, and possible distal LM2 occlusion, I suspect that she might have suffered a small embolic stroke in the setting of possible new atrial fibrillation. The vessel is somewhat distal and stroke is not particularly disabling so EVT will not be done.  I recommend admission for further workup and evaluation  Impression: Evaluate for stroke, new afib  RECOMMENDATIONS  Admit to hospitalist Frequent neurochecks Telemetry Twelve-lead EKG 2D echo A1c Lipid panel Aspirin 81 for now If A-fib was confirmed on EKG, will need to be started on anticoagulation-will defer to the stroke team rounding and further workup. DOAC timing will depend on size. If Afib is confirmed, can do heparin drip stroke protocol for now. Physical therapy Occupational Therapy Speech therapy Blood pressure goal: Allow for permissive hypertension and treat only if systolic blood pressures greater than 220. Check urinalysis and chest x-ray to evaluate for any underlying pneumonia or UTI which could mimic the current presentation. Plan was discussed in detail with her daughter, who is a speech therapist. Plan was also discussed with Dr. Rubin Payor Stroke team to follow  ______________________________________________________________________    Signed, Milon Dikes, MD [facility]

## 2023-06-11 NOTE — Progress Notes (Signed)
ANTICOAGULATION CONSULT NOTE  Pharmacy Consult for Heparin Indication: atrial fibrillation and stroke  No Known Allergies  Patient Measurements: Height: 5\' 3"  (160 cm) Weight: 77.4 kg (170 lb 10.2 oz) IBW/kg (Calculated) : 52.4 Heparin Dosing Weight: 69.1 kg  Vital Signs: Temp: 98.3 F (36.8 C) (02/21 1138) Temp Source: Oral (02/21 1138) BP: 165/93 (02/21 1330) Pulse Rate: 74 (02/21 1330)  Labs: Recent Labs    06/11/23 1148 06/11/23 1153  HGB 13.3 13.6  HCT 38.8 40.0  PLT 245  --   APTT 34  --   LABPROT 14.5  --   INR 1.1  --   CREATININE 1.01* 1.00    Estimated Creatinine Clearance: 41.3 mL/min (by C-G formula based on SCr of 1 mg/dL).   Medical History: History reviewed. No pertinent past medical history.  Assessment: 82 yof without significant history. Patient is presenting with dizziness and aphasia. Neurology work up concerning for CTA w/ suspicion for distal M2 occlusion. MRI also w/ infarction seen. Heparin per pharmacy consult placed for new onset atrial fibrillation and stroke.  Patient is not on anticoagulation prior to arrival.  Hgb 13.6; plt 245  Goal of Therapy:  Heparin level 0.3-0.5 units/ml Monitor platelets by anticoagulation protocol: Yes   Plan:  No bolusing per neurology Start heparin infusion at 850 units/hr Check anti-Xa level in 8 hours and daily while on heparin Continue to monitor H&H and platelets  Delmar Landau, PharmD, BCPS 06/11/2023 2:36 PM ED Clinical Pharmacist -  [phone]

## 2023-06-11 NOTE — Assessment & Plan Note (Signed)
Mild. Recent viral illness Check hep C Trend

## 2023-06-11 NOTE — Plan of Care (Signed)
Problem: Education: Goal: Knowledge of disease or condition will improve 06/11/2023 2032 by Lydia Macadamia, RN Outcome: Progressing 06/11/2023 2032 by Lydia Macadamia, RN Outcome: Progressing Goal: Knowledge of secondary prevention will improve (MUST DOCUMENT ALL) 06/11/2023 2032 by Lydia Macadamia, RN Outcome: Progressing 06/11/2023 2032 by Lydia Macadamia, RN Outcome: Progressing Goal: Knowledge of patient specific risk factors will improve (DELETE if not current risk factor) 06/11/2023 2032 by Lydia Macadamia, RN Outcome: Progressing 06/11/2023 2032 by Lydia Macadamia, RN Outcome: Progressing   Problem: Ischemic Stroke/TIA Tissue Perfusion: Goal: Complications of ischemic stroke/TIA will be minimized 06/11/2023 2032 by Lydia Macadamia, RN Outcome: Progressing 06/11/2023 2032 by Lydia Macadamia, RN Outcome: Progressing   Problem: Coping: Goal: Will verbalize positive feelings about self 06/11/2023 2032 by Lydia Macadamia, RN Outcome: Progressing 06/11/2023 2032 by Lydia Macadamia, RN Outcome: Progressing Goal: Will identify appropriate support needs 06/11/2023 2032 by Lydia Macadamia, RN Outcome: Progressing 06/11/2023 2032 by Lydia Macadamia, RN Outcome: Progressing   Problem: Health Behavior/Discharge Planning: Goal: Ability to manage health-related needs will improve 06/11/2023 2032 by Lydia Macadamia, RN Outcome: Progressing 06/11/2023 2032 by Lydia Macadamia, RN Outcome: Progressing Goal: Goals will be collaboratively established with patient/family 06/11/2023 2032 by Lydia Macadamia, RN Outcome: Progressing 06/11/2023 2032 by Lydia Macadamia, RN Outcome: Progressing   Problem: Self-Care: Goal: Ability to participate in self-care as condition permits will improve 06/11/2023 2032 by Lydia Macadamia, RN Outcome: Progressing 06/11/2023 2032 by Lydia Macadamia, RN Outcome: Progressing Goal: Verbalization of feelings and concerns over difficulty with self-care will  improve 06/11/2023 2032 by Lydia Macadamia, RN Outcome: Progressing 06/11/2023 2032 by Lydia Macadamia, RN Outcome: Progressing Goal: Ability to communicate needs accurately will improve 06/11/2023 2032 by Lydia Macadamia, RN Outcome: Progressing 06/11/2023 2032 by Lydia Macadamia, RN Outcome: Progressing   Problem: Nutrition: Goal: Risk of aspiration will decrease 06/11/2023 2032 by Lydia Macadamia, RN Outcome: Progressing 06/11/2023 2032 by Lydia Macadamia, RN Outcome: Progressing Goal: Dietary intake will improve 06/11/2023 2032 by Lydia Macadamia, RN Outcome: Progressing 06/11/2023 2032 by Lydia Macadamia, RN Outcome: Progressing   Problem: Education: Goal: Knowledge of General Education information will improve Description: Including pain rating scale, medication(s)/side effects and non-pharmacologic comfort measures 06/11/2023 2032 by Lydia Macadamia, RN Outcome: Progressing 06/11/2023 2032 by Lydia Macadamia, RN Outcome: Progressing   Problem: Health Behavior/Discharge Planning: Goal: Ability to manage health-related needs will improve 06/11/2023 2032 by Lydia Macadamia, RN Outcome: Progressing 06/11/2023 2032 by Lydia Macadamia, RN Outcome: Progressing   Problem: Clinical Measurements: Goal: Ability to maintain clinical measurements within normal limits will improve 06/11/2023 2032 by Lydia Macadamia, RN Outcome: Progressing 06/11/2023 2032 by Lydia Macadamia, RN Outcome: Progressing Goal: Will remain free from infection 06/11/2023 2032 by Lydia Macadamia, RN Outcome: Progressing 06/11/2023 2032 by Lydia Macadamia, RN Outcome: Progressing Goal: Diagnostic test results will improve 06/11/2023 2032 by Lydia Macadamia, RN Outcome: Progressing 06/11/2023 2032 by Lydia Macadamia, RN Outcome: Progressing Goal: Respiratory complications will improve 06/11/2023 2032 by Lydia Macadamia, RN Outcome: Progressing 06/11/2023 2032 by Lydia Macadamia, RN Outcome: Progressing Goal:  Cardiovascular complication will be avoided 06/11/2023 2032 by Lydia Macadamia, RN Outcome: Progressing 06/11/2023 2032 by Lydia Macadamia, RN Outcome: Progressing   Problem: Activity: Goal: Risk for activity intolerance will decrease 06/11/2023 2032 by Lydia Macadamia, RN Outcome: Progressing 06/11/2023 2032 by  Lydia Macadamia, RN Outcome: Progressing   Problem: Nutrition: Goal: Adequate nutrition will be maintained 06/11/2023 2032 by Lydia Macadamia, RN Outcome: Progressing 06/11/2023 2032 by Lydia Macadamia, RN Outcome: Progressing   Problem: Coping: Goal: Level of anxiety will decrease 06/11/2023 2032 by Lydia Macadamia, RN Outcome: Progressing 06/11/2023 2032 by Lydia Macadamia, RN Outcome: Progressing   Problem: Elimination: Goal: Will not experience complications related to bowel motility 06/11/2023 2032 by Lydia Macadamia, RN Outcome: Progressing 06/11/2023 2032 by Lydia Macadamia, RN Outcome: Progressing Goal: Will not experience complications related to urinary retention 06/11/2023 2032 by Lydia Macadamia, RN Outcome: Progressing 06/11/2023 2032 by Lydia Macadamia, RN Outcome: Progressing   Problem: Pain Managment: Goal: General experience of comfort will improve and/or be controlled 06/11/2023 2032 by Lydia Macadamia, RN Outcome: Progressing 06/11/2023 2032 by Lydia Macadamia, RN Outcome: Progressing   Problem: Safety: Goal: Ability to remain free from injury will improve 06/11/2023 2032 by Lydia Macadamia, RN Outcome: Progressing 06/11/2023 2032 by Lydia Macadamia, RN Outcome: Progressing   Problem: Skin Integrity: Goal: Risk for impaired skin integrity will decrease 06/11/2023 2032 by Lydia Macadamia, RN Outcome: Progressing 06/11/2023 2032 by Lydia Macadamia, RN Outcome: Progressing   Problem: Education: Goal: Knowledge of disease or condition will improve 06/11/2023 2032 by Lydia Macadamia, RN Outcome: Progressing 06/11/2023 2032 by Lydia Macadamia,  RN Outcome: Progressing Goal: Understanding of medication regimen will improve 06/11/2023 2032 by Lydia Macadamia, RN Outcome: Progressing 06/11/2023 2032 by Lydia Macadamia, RN Outcome: Progressing Goal: Individualized Educational Video(s) 06/11/2023 2032 by Lydia Macadamia, RN Outcome: Progressing 06/11/2023 2032 by Lydia Macadamia, RN Outcome: Progressing   Problem: Activity: Goal: Ability to tolerate increased activity will improve 06/11/2023 2032 by Lydia Macadamia, RN Outcome: Progressing 06/11/2023 2032 by Lydia Macadamia, RN Outcome: Progressing   Problem: Cardiac: Goal: Ability to achieve and maintain adequate cardiopulmonary perfusion will improve 06/11/2023 2032 by Lydia Macadamia, RN Outcome: Progressing 06/11/2023 2032 by Lydia Macadamia, RN Outcome: Progressing   Problem: Health Behavior/Discharge Planning: Goal: Ability to safely manage health-related needs after discharge will improve 06/11/2023 2032 by Lydia Macadamia, RN Outcome: Progressing 06/11/2023 2032 by Lydia Macadamia, RN Outcome: Progressing

## 2023-06-11 NOTE — Assessment & Plan Note (Addendum)
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 No hemorrhage on MRI. Anti-coag per stroke team  -Permissive hypertension first 24 hours <220/110 -N.p.o. until bedside swallow screen -PT/ OT/ SLP consult

## 2023-06-11 NOTE — Code Documentation (Addendum)
Lydia Garcia is an 83 yr old female with no significant PMH arriving to [facility] via POV on 06/11/2023. Pt is coming from home where she was last known to be well last night at 1830 by her daughter, and now is complaining of aphasia. She is on no anticoagulant. Code stroke activated by EDP.     Pt met by Stroke team in CT scanner. NIHSS 1, for mild aphasia. The following imaging was obtained: CT, CTA and P. Per Dr. Wilford Corner, CT is negative for hemorrhage, CTA is negative for LVO, and there is no perfusion mismatch.     Pt returned to ED room 34 where her workup will continue. She will need q 2 hr NIHSS and VS for 12 hours, then q 4. She will remain NPO until passing stroke swallow screen. She is ineligible for TNK as she is outside of the treatment window. She is not a candidate for thrombectomy as she is LVO negative.  Bedside handoff with [facility] RN complete.

## 2023-06-11 NOTE — ED Provider Notes (Signed)
[place] EMERGENCY DEPARTMENT AT [facility] Provider Note   CSN: [id number] Arrival date & time: 06/11/23  1118     History  Chief Complaint  Patient presents with   Altered Mental Status    Lydia Garcia is a 83 y.o. female.   Altered Mental Status Patient came in for mental status change.  Reportedly was normal until 9:00 this morning.  Although with further history through family members 630 last night could be is considered last normal.  Reportedly was dizzy this morning when she got up and was having difficulty speaking.  She had talked to family numbers on the phone who found some abnormality and was told to call the ambulance.  No headache.  Was hypertensive.    History reviewed. No pertinent past medical history.  Home Medications Prior to Admission medications   Not on File      Allergies    Patient has no known allergies.    Review of Systems   Review of Systems  Physical Exam Updated Vital Signs BP (!) 165/93   Pulse 74   Temp 98.3 F (36.8 C) (Oral)   Resp 16   Ht 5\' 3"  (1.6 m)   Wt 77.4 kg   SpO2 99%   BMI 30.23 kg/m  Physical Exam Vitals and nursing note reviewed.  Eyes:     Pupils: Pupils are equal, round, and reactive to light.  Cardiovascular:     Rate and Rhythm: Regular rhythm.  Pulmonary:     Breath sounds: No wheezing.  Abdominal:     Tenderness: There is no abdominal tenderness.  Musculoskeletal:     Cervical back: Neck supple.  Neurological:     Mental Status: She is alert.     Comments: Awake and appropriate.  No nystagmus.  Finger-nose intact.  Does have occasional word finding issues.  Complete NIH scoring done by neurology.     ED Results / Procedures / Treatments   Labs (all labs ordered are listed, but only abnormal results are displayed) Labs Reviewed  COMPREHENSIVE METABOLIC PANEL - Abnormal; Notable for the following components:      Result Value   CO2 20 (*)    Glucose, Bld 111 (*)    Creatinine,  Ser 1.01 (*)    AST 43 (*)    ALT 45 (*)    GFR, Estimated 55 (*)    All other components within normal limits  URINALYSIS, ROUTINE W REFLEX MICROSCOPIC - Abnormal; Notable for the following components:   Specific Gravity, Urine >1.046 (*)    All other components within normal limits  I-STAT CHEM 8, ED - Abnormal; Notable for the following components:   Glucose, Bld 109 (*)    All other components within normal limits  ETHANOL  PROTIME-INR  APTT  CBC  DIFFERENTIAL  HEMOGLOBIN A1C  RAPID URINE DRUG SCREEN, HOSP PERFORMED    EKG EKG Interpretation Date/Time:  Friday June 11 2023 12:25:06 EST Ventricular Rate:  74 PR Interval:    QRS Duration:  46 QT Interval:  620 QTC Calculation: 689 R Axis:   -58  Text Interpretation: Atrial fibrillation Left anterior fascicular block Low voltage, precordial leads Consider anterior infarct Prolonged QT interval Confirmed by Benjiman Core [phone]) on 06/11/2023 2:21:05 PM  Radiology CT ANGIO HEAD NECK W WO CM W PERF (CODE STROKE) Result Date: 06/11/2023 CLINICAL DATA:  Provided history: Neuro deficit, acute, stroke suspected. Additional history obtained from electronic MEDICAL RECORD NUMBERDizziness, aphasia, dysarthria. EXAM: CT ANGIOGRAPHY HEAD  AND NECK CT PERFUSION BRAIN TECHNIQUE: Multidetector CT imaging of the head and neck was performed using the standard protocol during bolus administration of intravenous contrast. Multiplanar CT image reconstructions and MIPs were obtained to evaluate the vascular anatomy. Carotid stenosis measurements (when applicable) are obtained utilizing NASCET criteria, using the distal internal carotid diameter as the denominator. Multiphase CT imaging of the brain was performed following IV bolus contrast injection. Subsequent parametric perfusion maps were calculated using RAPID software. RADIATION DOSE REDUCTION: This exam was performed according to the departmental dose-optimization program which includes  automated exposure control, adjustment of the mA and/or kV according to patient size and/or use of iterative reconstruction technique. CONTRAST:  OMNIPAQUE IOHEXOL 350 MG/ML SOLN COMPARISON:  Non-contrast head CT performed earlier today 06/11/2023. FINDINGS: CTA NECK FINDINGS Aortic arch: Standard aortic branching. Atherosclerotic plaque within the visualized thoracic aorta. Streak/beam hardening artifact arising from a dense contrast bolus partially obscures the left subclavian artery. Within this limitation, there is no appreciable hemodynamically significant innominate or proximal subclavian artery stenosis. Right carotid system: CCA and ICA patent within the neck without stenosis or significant atherosclerotic disease. Partially retropharyngeal course of the distal common carotid and proximal internal carotid arteries. Left carotid system: CCA and ICA patent within the neck without stenosis or significant atherosclerotic disease. Partially retropharyngeal course of the distal common carotid and proximal internal carotid arteries. Vertebral arteries: Codominant and patent within the neck. Venous reflux of contrast obscures portions of the left vertebral artery V1 segment. Within this limitation, no stenosis is identified within the cervical vertebral arteries. Skeleton: Nonspecific reversal of the expected cervical lordosis. Cervical spondylosis. No acute fracture or aggressive osseous lesion. Other neck: No neck mass or cervical lymphadenopathy. Upper chest: No consolidation within the imaged lung apices. Review of the MIP images confirms the above findings CTA HEAD FINDINGS Anterior circulation: The intracranial internal carotid arteries are patent. Non-stenotic atherosclerotic plaque within both vessels. No right M2 proximal branch occlusion or high-grade proximal arterial stenosis identified. A distal M2 left MCA vessel is poorly delineated (series 12, image 31). The anterior cerebral arteries are  patent. No intracranial aneurysm is identified. Posterior circulation: The intracranial vertebral arteries are patent. The basilar artery is patent. The posterior cerebral arteries are patent. Posterior communicating arteries are present bilaterally. Venous sinuses: Within the limitations of contrast timing, no convincing thrombus. Anatomic variants: As described. Review of the MIP images confirms the above findings CT Brain Perfusion Findings: CBF (<30%) Volume: 0mL Perfusion (Tmax>6.0s) volume: 0mL Mismatch Volume: 0mL Infarction Location:None identified. CTA head impression #1 called by telephone at the time of interpretation on 06/11/2023 at 12:42 pm to provider [facility] , who verbally acknowledged these results. IMPRESSION: CTA neck: 1. Venous reflux of contrast obscures portions of the left vertebral artery V1 segment. Within this limitation, the common carotid, internal carotid and vertebral arteries are patent within the neck without stenosis or significant atherosclerotic disease. 2. Aortic Atherosclerosis (ICD10-I70.0). CTA head: 1. A distal M2 left middle cerebral artery vessel is poorly delineated, suspicious for vessel occlusion. 2. Non-stenotic atherosclerotic plaque within the intracranial internal carotid arteries. CT perfusion head: The perfusion software identifies no core infarct. The perfusion software identifies no critically hypoperfused parenchyma (utilizing the Tmax>6 seconds threshold). No mismatch volume reported. Electronically Signed   By: Jackey Loge D.O.   On: 06/11/2023 12:42   CT HEAD CODE STROKE WO CONTRAST Result Date: 06/11/2023 CLINICAL DATA:  Code stroke. Neuro deficit, acute, stroke suspected. EXAM: CT HEAD WITHOUT CONTRAST TECHNIQUE:  Contiguous axial images were obtained from the base of the skull through the vertex without intravenous contrast. RADIATION DOSE REDUCTION: This exam was performed according to the departmental dose-optimization program which includes  automated exposure control, adjustment of the mA and/or kV according to patient size and/or use of iterative reconstruction technique. COMPARISON:  None Available. FINDINGS: Brain: No acute hemorrhage. Cortical gray-white differentiation is preserved. 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 Prominence of the ventricles and sulci within normal limits for age. No extra-axial collection. Basilar cisterns are patent. Vascular: No hyperdense vessel or unexpected calcification. Skull: No calvarial fracture or suspicious bone lesion. Skull base is unremarkable. Sinuses/Orbits: No acute finding. Other: None. ASPECTS ([place] Stroke Program Early CT Score) - Ganglionic level infarction (caudate, lentiform nuclei, internal capsule, insula, M1-M3 cortex): 7 - Supraganglionic infarction (M4-M6 cortex): 3 Total score (0-10 with 10 being normal): 10 IMPRESSION: No acute intracranial hemorrhage or evidence of acute large vessel territory infarct. ASPECT score is 10. Code stroke imaging results were communicated on 06/11/2023 at 12:10 pm to provider Dr. Wilford Corner via secure text paging. Electronically Signed   By: Orvan Falconer M.D.   On: 06/11/2023 12:11    Procedures Procedures    Medications Ordered in ED Medications   stroke: early stages of recovery book (has no administration in time range)  iohexol (OMNIPAQUE) 350 MG/ML injection 100 mL (100 mLs Intravenous Contrast Given 06/11/23 1214)    ED Course/ Medical Decision Making/ A&P         CHA2DS2-VASc Score: 6                        Medical Decision Making Amount and/or Complexity of Data Reviewed Labs: ordered. Radiology: ordered.  Risk Decision regarding hospitalization.   Patient came in and code stroke activated in triage.  Although with further history last normal may have been last night.  Does have some deficits.  Met by myself and Dr. Wilford Corner in the CT scanner.  Not a TNK  candidate due to last normal.  Also relatively mild deficits.  CT scan shows possible distal MCA lesion.  No perfusion deficit however.  Will require admission to the hospital.  CRITICAL CARE Performed by: Benjiman Core Total critical care time: 30 minutes Critical care time was exclusive of separately billable procedures and treating other patients. Critical care was necessary to treat or prevent imminent or life-threatening deterioration. Critical care was time spent personally by me on the following activities: development of treatment plan with patient and/or surrogate as well as nursing, discussions with consultants, evaluation of patient's response to treatment, examination of patient, obtaining history from patient or surrogate, ordering and performing treatments and interventions, ordering and review of laboratory studies, ordering and review of radiographic studies, pulse oximetry and re-evaluation of patient's condition.  EKG shows atrial fibrillation.  No known history of it.  Likely cause of the stroke.         Final Clinical Impression(s) / ED Diagnoses Final diagnoses:  Dysarthria  Atrial fibrillation, unspecified type [facility])    Rx / DC Orders ED Discharge Orders     None         Benjiman Core, MD 06/11/23 1422

## 2023-06-11 NOTE — Progress Notes (Signed)
   06/11/23 1556  TOC Brief Assessment  Insurance and Status Reviewed ([employer] PPO)  Patient has primary care physician Yes Cyndie Chime, Selena Batten, NP)  Home environment has been reviewed from home  Prior level of function: independent  Prior/Current Home Services No current home services  Social Drivers of Health Review SDOH reviewed no interventions necessary  Readmission risk has been reviewed Yes (N/A listed)  Transition of care needs no transition of care needs at this time   [facility] will continue to follow patient for any additional discharge needs  Please place consult if needed

## 2023-06-11 NOTE — Progress Notes (Signed)
  Echocardiogram 2D Echocardiogram has been performed.  [name] 06/11/2023, 5:02 PM

## 2023-06-12 ENCOUNTER — Other Ambulatory Visit (HOSPITAL_COMMUNITY): Payer: Self-pay

## 2023-06-12 DIAGNOSIS — I4589 Other specified conduction disorders: Secondary | ICD-10-CM | POA: Diagnosis not present

## 2023-06-12 DIAGNOSIS — I639 Cerebral infarction, unspecified: Secondary | ICD-10-CM

## 2023-06-12 DIAGNOSIS — I6389 Other cerebral infarction: Secondary | ICD-10-CM | POA: Diagnosis not present

## 2023-06-12 DIAGNOSIS — I480 Paroxysmal atrial fibrillation: Secondary | ICD-10-CM | POA: Diagnosis not present

## 2023-06-12 DIAGNOSIS — R7401 Elevation of levels of liver transaminase levels: Secondary | ICD-10-CM | POA: Diagnosis not present

## 2023-06-12 LAB — MAGNESIUM: Magnesium: 1.8 mg/dL (ref 1.7–2.4)

## 2023-06-12 LAB — LIPID PANEL
Cholesterol: 119 mg/dL (ref 0–200)
HDL: 67 mg/dL (ref >40)
LDL Cholesterol: 46 mg/dL (ref 0–99)
Total CHOL/HDL Ratio: 1.8 {ratio}
Triglycerides: 29 mg/dL (ref <150)
VLDL: 6 mg/dL (ref 0–40)

## 2023-06-12 LAB — CBC
HCT: 35.6 % — ABNORMAL LOW (ref 36.0–46.0)
Hemoglobin: 12.2 g/dL (ref 12.0–15.0)
MCH: 32.4 pg (ref 26.0–34.0)
MCHC: 34.3 g/dL (ref 30.0–36.0)
MCV: 94.7 fL (ref 80.0–100.0)
Platelets: 220 10*3/uL (ref 150–400)
RBC: 3.76 MIL/uL — ABNORMAL LOW (ref 3.87–5.11)
RDW: 13.6 % (ref 11.5–15.5)
WBC: 3.4 10*3/uL — ABNORMAL LOW (ref 4.0–10.5)
nRBC: 0 % (ref 0.0–0.2)

## 2023-06-12 LAB — COMPREHENSIVE METABOLIC PANEL
ALT: 34 U/L (ref 0–44)
AST: 27 U/L (ref 15–41)
Albumin: 3.4 g/dL — ABNORMAL LOW (ref 3.5–5.0)
Alkaline Phosphatase: 59 U/L (ref 38–126)
Anion gap: 10 (ref 5–15)
BUN: 12 mg/dL (ref 8–23)
CO2: 24 mmol/L (ref 22–32)
Calcium: 8.9 mg/dL (ref 8.9–10.3)
Chloride: 106 mmol/L (ref 98–111)
Creatinine, Ser: 0.86 mg/dL (ref 0.44–1.00)
GFR, Estimated: >60 mL/min (ref >60)
Glucose, Bld: 86 mg/dL (ref 70–99)
Potassium: 4 mmol/L (ref 3.5–5.1)
Sodium: 140 mmol/L (ref 135–145)
Total Bilirubin: 1.1 mg/dL (ref 0.0–1.2)
Total Protein: 5.8 g/dL — ABNORMAL LOW (ref 6.5–8.1)

## 2023-06-12 LAB — HEPARIN LEVEL (UNFRACTIONATED)
Heparin Unfractionated: 0.25 [IU]/mL — ABNORMAL LOW (ref 0.30–0.70)
Heparin Unfractionated: 0.81 [IU]/mL — ABNORMAL HIGH (ref 0.30–0.70)

## 2023-06-12 MED ORDER — METOPROLOL TARTRATE 25 MG PO TABS: 25.0000 mg | ORAL_TABLET | Freq: Two times a day (BID) | ORAL | Status: DC

## 2023-06-12 MED ORDER — APIXABAN 5 MG PO TABS
5.0000 mg | ORAL_TABLET | Freq: Two times a day (BID) | ORAL | 0 refills | Status: DC
  Filled 2023-06-12: qty 60, 30d supply, fill #0

## 2023-06-12 MED ORDER — APIXABAN 5 MG PO TABS
5.0000 mg | ORAL_TABLET | Freq: Two times a day (BID) | ORAL | Status: DC
  Administered 2023-06-12: 5 mg via ORAL
  Filled 2023-06-12: qty 1

## 2023-06-12 MED ORDER — METOPROLOL TARTRATE 25 MG PO TABS
25.0000 mg | ORAL_TABLET | Freq: Two times a day (BID) | ORAL | 0 refills | Status: DC
  Filled 2023-06-12: qty 60, 30d supply, fill #0

## 2023-06-12 MED ORDER — DILTIAZEM HCL 60 MG PO TABS: 60.0000 mg | ORAL_TABLET | Freq: Two times a day (BID) | ORAL | Status: DC

## 2023-06-12 MED ORDER — MAGNESIUM SULFATE 2 GM/50ML IV SOLN
2.0000 g | Freq: Once | INTRAVENOUS | Status: AC
  Administered 2023-06-12: 2 g via INTRAVENOUS
  Filled 2023-06-12: qty 50

## 2023-06-12 NOTE — Discharge Summary (Signed)
 Krysten Veronica ZOX:[id number] DOB: [birth year] DOA: 06/11/2023  PCP: [name], NP  Admit date: 06/11/2023  Discharge date: 06/12/2023  Admitted From: Home   Disposition:  Home   Recommendations for Outpatient Follow-up:   Follow up with PCP in 1-2 weeks  PCP Please obtain BMP/CBC, 2 view CXR in 1week,  (see Discharge instructions)   PCP Please follow up on the following pending results:    Home Health: None   Equipment/Devices: None  Consultations: Neuro Discharge Condition: Stable    CODE STATUS: Full    Diet Recommendation: Heart Healthy     Chief Complaint  Patient presents with   Altered Mental Status     Brief history of present illness from the day of admission and additional interim summary    83 y.o. female with no significant medical history who presented with complaints of dizziness and aphasia that started around 9AM this morning. Her daughter in a SLP and was there and states she started to have aphasia. Her dizziness resolved, but her speech continued to be aphasic, the ER workup was consistent with acute ischemic stroke and newly diagnosed A-fib.  She was admitted for further care.                                                                 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 No acute hemorrhage or mass effect - she was seen by the stroke team Case discussed with stroke team including her CTA findings on 06/12/2023, ischemic stroke likely due to A-fib, placed on Eliquis, A1c LDL stable, underwent full stroke workup, all her symptoms have resolved completely and she is back to her baseline.  Will be discharged home with outpatient PCP and neurology follow-up.  Paroxysmal  A-fib.  [place] vas 2 score of greater than 4.  According to the patient she has had intermittent irregular heartbeat for a long time, however has never had a formal evaluation, TSH stable, echo stable, placed on Eliquis and low-dose beta-blocker, outpatient cardiology follow-up  Prolonged QTc.  Resolved on EKG morning of 06/12/2023.  Low-dose beta-blocker, borderline magnesium replaced.  Outpatient follow-up with cardiology.  Recent viral illness with asymptomatic mild transaminitis at the time of admission.  Completely resolved.    Discharge diagnosis     Principal Problem:   Acute CVA (cerebrovascular accident) [facility]) Active Problems:   New onset atrial fibrillation (HCC)   Prolonged QT interval   Transaminitis    Discharge instructions    Discharge Instructions     Amb referral to AFIB Clinic   Complete by: As directed    Diet - low sodium heart healthy   Complete by: As directed    Discharge instructions   Complete  by: As directed    Follow with Primary MD [name], NP in 7 days   Get CBC, CMP, 2 view Chest X ray -  checked next visit with your primary MD   Activity: As tolerated with Full fall precautions use walker/cane & assistance as needed  Disposition Home     Diet: Heart Healthy   Special Instructions: If you have smoked or chewed Tobacco  in the last 2 yrs please stop smoking, stop any regular Alcohol  and or any Recreational drug use.  On your next visit with your primary care physician please Get Medicines reviewed and adjusted.  Please request your Prim.MD to go over all Hospital Tests and Procedure/Radiological results at the follow up, please get all Hospital records sent to your Prim MD by signing hospital release before you go home.  If you experience worsening of your admission symptoms, develop shortness of breath, life threatening emergency, suicidal or homicidal thoughts you must seek medical attention immediately by calling 911 or calling your MD  immediately  if symptoms less severe.  You Must read complete instructions/literature along with all the possible adverse reactions/side effects for all the Medicines you take and that have been prescribed to you. Take any new Medicines after you have completely understood and accpet all the possible adverse reactions/side effects.   Do not drive when taking Pain medications.  Do not take more than prescribed Pain, Sleep and Anxiety Medications   Increase activity slowly   Complete by: As directed        Discharge Medications   Allergies as of 06/12/2023   No Known Allergies      Medication List     TAKE these medications    apixaban 5 MG Tabs tablet Commonly known as: ELIQUIS Take 1 tablet (5 mg total) by mouth 2 (two) times daily.   metoprolol tartrate 25 MG tablet Commonly known as: LOPRESSOR Take 1 tablet (25 mg total) by mouth 2 (two) times daily. Start taking on: June 13, 2023         Follow-up Information     [name], NP. Schedule an appointment as soon as possible for a visit in 1 week(s).   Specialty: Nurse Practitioner Contact information: [address] [place] [address] [phone]         [facility] Follow up in 2 week(s).   Specialty: Neurology Contact information: [address] [place] [address] [phone]        Croitoru, Rachelle Hora, MD. Schedule an appointment as soon as possible for a visit in 2 week(s).   Specialty: Cardiology Contact information: [address] [place] [address] [phone]                 Major procedures and Radiology Reports - PLEASE review detailed and final reports thoroughly  -       ECHOCARDIOGRAM COMPLETE Result Date: 06/11/2023    ECHOCARDIOGRAM REPORT   Patient Name:   LOLITHA TORTORA Date of Exam: 06/11/2023 Medical Rec #:  [id number]        Height:       63.0 in Accession #:    [id number]       Weight:        170.6 lb Date of Birth:  12-Nov-1940        BSA:          1.807 m Patient Age:    83 years  BP:           165/92 mmHg Patient Gender: F                HR:           80 bpm. Exam Location:  Inpatient Procedure: 2D Echo (Both Spectral and Color Flow Doppler were utilized during            procedure). Indications:    stroke  History:        Patient has no prior history of Echocardiogram examinations.                 Arrythmias:Atrial Fibrillation.  Sonographer:    [name] RDCS Referring Phys: [id number] DEVON SHAFER IMPRESSIONS  1. Left ventricular ejection fraction, by estimation, is 60 to 65%. The left ventricle has normal function. The left ventricle has no regional wall motion abnormalities. There is mild concentric left ventricular hypertrophy. Left ventricular diastolic function could not be evaluated.  2. Right ventricular systolic function is normal. The right ventricular size is mildly enlarged. There is moderately elevated pulmonary artery systolic pressure. The estimated right ventricular systolic pressure is 59.1 mmHg.  3. Left atrial size was mild to moderately dilated.  4. Right atrial size was moderately dilated.  5. The mitral valve is normal in structure. Mild mitral valve regurgitation. No evidence of mitral stenosis.  6. Tricuspid valve regurgitation is severe.  7. The aortic valve is normal in structure. Aortic valve regurgitation is not visualized. No aortic stenosis is present.  8. The inferior vena cava is dilated in size with <50% respiratory variability, suggesting right atrial pressure of 15 mmHg. FINDINGS  Left Ventricle: Left ventricular ejection fraction, by estimation, is 60 to 65%. The left ventricle has normal function. The left ventricle has no regional wall motion abnormalities. Strain imaging was not performed. The left ventricular internal cavity  size was normal in size. There is mild concentric left ventricular hypertrophy. Left ventricular diastolic function could  not be evaluated due to atrial fibrillation. Left ventricular diastolic function could not be evaluated. Right Ventricle: The right ventricular size is mildly enlarged. No increase in right ventricular wall thickness. Right ventricular systolic function is normal. There is moderately elevated pulmonary artery systolic pressure. The tricuspid regurgitant velocity is 3.32 m/s, and with an assumed right atrial pressure of 15 mmHg, the estimated right ventricular systolic pressure is 59.1 mmHg. Left Atrium: Left atrial size was mild to moderately dilated. Right Atrium: Right atrial size was moderately dilated. Pericardium: There is no evidence of pericardial effusion. Mitral Valve: The mitral valve is normal in structure. Mild mitral valve regurgitation. No evidence of mitral valve stenosis. Tricuspid Valve: The tricuspid valve is normal in structure. Tricuspid valve regurgitation is severe. No evidence of tricuspid stenosis. Aortic Valve: The aortic valve is normal in structure. Aortic valve regurgitation is not visualized. No aortic stenosis is present. Pulmonic Valve: The pulmonic valve was normal in structure. Pulmonic valve regurgitation is not visualized. No evidence of pulmonic stenosis. Aorta: The aortic root is normal in size and structure. Venous: The inferior vena cava is dilated in size with less than 50% respiratory variability, suggesting right atrial pressure of 15 mmHg. IAS/Shunts: No atrial level shunt detected by color flow Doppler. Additional Comments: 3D imaging was not performed.  LEFT VENTRICLE PLAX 2D LVIDd:         4.20 cm LVIDs:         2.60 cm LV PW:  1.00 cm LV IVS:        1.20 cm LVOT diam:     1.80 cm LVOT Area:     2.54 cm  RIGHT VENTRICLE             IVC RV Basal diam:  2.70 cm     IVC diam: 2.10 cm RV S prime:     10.10 cm/s TAPSE (M-mode): 1.5 cm LEFT ATRIUM           Index        RIGHT ATRIUM           Index LA diam:      4.20 cm 2.32 cm/m   RA Area:     17.50 cm LA Vol (A2C):  32.6 ml 18.04 ml/m  RA Volume:   41.00 ml  22.68 ml/m LA Vol (A4C): 70.3 ml 38.89 ml/m   AORTA Ao Root diam: 2.70 cm Ao Asc diam:  3.00 cm TRICUSPID VALVE TR Peak grad:   44.1 mmHg TR Vmax:        332.00 cm/s  SHUNTS Systemic Diam: 1.80 cm Arvilla Meres MD Electronically signed by Arvilla Meres MD Signature Date/Time: 06/11/2023/5:08:06 PM    Final    MR BRAIN WO CONTRAST Result Date: 06/11/2023 CLINICAL DATA:  Neuro deficit, acute, stroke suspected. EXAM: MRI HEAD WITHOUT CONTRAST TECHNIQUE: Multiplanar, multiecho pulse sequences of the brain and surrounding structures were obtained without intravenous contrast. COMPARISON:  Head CT and CTA head/neck 06/11/2023. 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 No acute hemorrhage or mass effect. Focal susceptibility in the posterior aspect of the left sylvian fissure likely corresponds to the vessel occlusion seen on same day CTA. Hydrocephalus or extra-axial collection. No mass or midline shift. Vascular: As above.  Otherwise normal flow voids. Skull and upper cervical spine: Normal marrow signal. Sinuses/Orbits: No acute findings. Other: None. IMPRESSION: 1. 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 No acute hemorrhage or mass effect. 2. Focal susceptibility in the posterior aspect of the left Sylvian fissure likely corresponds to the vessel occlusion seen on same day CTA. Electronically Signed   By: Orvan Falconer M.D.   On: 06/11/2023 14:27   CT ANGIO HEAD NECK W WO CM W PERF (CODE STROKE) Result Date: 06/11/2023 CLINICAL DATA:  Provided history: Neuro deficit, acute, stroke suspected. Additional history obtained from electronic MEDICAL RECORD NUMBERDizziness, aphasia, dysarthria. EXAM: CT ANGIOGRAPHY HEAD AND NECK CT PERFUSION BRAIN TECHNIQUE: Multidetector CT  imaging of the head and neck was performed using the standard protocol during bolus administration of intravenous contrast. Multiplanar CT image reconstructions and MIPs were obtained to evaluate the vascular anatomy. Carotid stenosis measurements (when applicable) are obtained utilizing NASCET criteria, using the distal internal carotid diameter as the denominator. Multiphase CT imaging of the brain was performed following IV bolus contrast injection. Subsequent parametric perfusion maps were calculated using RAPID software. RADIATION DOSE REDUCTION: This exam was performed according to the departmental dose-optimization program which includes automated exposure control, adjustment of the mA and/or kV according to patient size and/or use of iterative reconstruction technique. CONTRAST:  OMNIPAQUE IOHEXOL 350 MG/ML SOLN COMPARISON:  Non-contrast head CT performed earlier today 06/11/2023. FINDINGS: CTA NECK FINDINGS Aortic arch: Standard aortic branching. Atherosclerotic plaque within the visualized thoracic aorta. Streak/beam hardening artifact arising from a dense contrast bolus partially obscures the left subclavian artery. Within this  limitation, there is no appreciable hemodynamically significant innominate or proximal subclavian artery stenosis. Right carotid system: CCA and ICA patent within the neck without stenosis or significant atherosclerotic disease. Partially retropharyngeal course of the distal common carotid and proximal internal carotid arteries. Left carotid system: CCA and ICA patent within the neck without stenosis or significant atherosclerotic disease. Partially retropharyngeal course of the distal common carotid and proximal internal carotid arteries. Vertebral arteries: Codominant and patent within the neck. Venous reflux of contrast obscures portions of the left vertebral artery V1 segment. Within this limitation, no stenosis is identified within the cervical vertebral arteries.  Skeleton: Nonspecific reversal of the expected cervical lordosis. Cervical spondylosis. No acute fracture or aggressive osseous lesion. Other neck: No neck mass or cervical lymphadenopathy. Upper chest: No consolidation within the imaged lung apices. Review of the MIP images confirms the above findings CTA HEAD FINDINGS Anterior circulation: The intracranial internal carotid arteries are patent. Non-stenotic atherosclerotic plaque within both vessels. No right M2 proximal branch occlusion or high-grade proximal arterial stenosis identified. A distal M2 left MCA vessel is poorly delineated (series 12, image 31). The anterior cerebral arteries are patent. No intracranial aneurysm is identified. Posterior circulation: The intracranial vertebral arteries are patent. The basilar artery is patent. The posterior cerebral arteries are patent. Posterior communicating arteries are present bilaterally. Venous sinuses: Within the limitations of contrast timing, no convincing thrombus. Anatomic variants: As described. Review of the MIP images confirms the above findings CT Brain Perfusion Findings: CBF (<30%) Volume: 0mL Perfusion (Tmax>6.0s) volume: 0mL Mismatch Volume: 0mL Infarction Location:None identified. CTA head impression #1 called by telephone at the time of interpretation on 06/11/2023 at 12:42 pm to provider [facility] , who verbally acknowledged these results. IMPRESSION: CTA neck: 1. Venous reflux of contrast obscures portions of the left vertebral artery V1 segment. Within this limitation, the common carotid, internal carotid and vertebral arteries are patent within the neck without stenosis or significant atherosclerotic disease. 2. Aortic Atherosclerosis (ICD10-I70.0). CTA head: 1. A distal M2 left middle cerebral artery vessel is poorly delineated, suspicious for vessel occlusion. 2. Non-stenotic atherosclerotic plaque within the intracranial internal carotid arteries. CT perfusion head: The perfusion software  identifies no core infarct. The perfusion software identifies no critically hypoperfused parenchyma (utilizing the Tmax>6 seconds threshold). No mismatch volume reported. Electronically Signed   By: Jackey Loge D.O.   On: 06/11/2023 12:42   CT HEAD CODE STROKE WO CONTRAST Result Date: 06/11/2023 CLINICAL DATA:  Code stroke. Neuro deficit, acute, stroke suspected. EXAM: CT HEAD WITHOUT CONTRAST TECHNIQUE: Contiguous axial images were obtained from the base of the skull through the vertex without intravenous contrast. RADIATION DOSE REDUCTION: This exam was performed according to the departmental dose-optimization program which includes automated exposure control, adjustment of the mA and/or kV according to patient size and/or use of iterative reconstruction technique. COMPARISON:  None Available. FINDINGS: Brain: No acute hemorrhage. Cortical gray-white differentiation is preserved. 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 Prominence of the ventricles and sulci within normal limits for age. No extra-axial collection. Basilar cisterns are patent. Vascular: No hyperdense vessel or unexpected calcification. Skull: No calvarial fracture or suspicious bone lesion. Skull base is unremarkable. Sinuses/Orbits: No acute finding. Other: None. ASPECTS [facility] Stroke Program Early CT Score) - Ganglionic level infarction (caudate, lentiform nuclei, internal capsule, insula, M1-M3 cortex): 7 - Supraganglionic infarction (M4-M6 cortex): 3 Total score (0-10 with 10 being normal): 10 IMPRESSION: No acute intracranial hemorrhage or evidence of  acute large vessel territory infarct. ASPECT score is 10. Code stroke imaging results were communicated on 06/11/2023 at 12:10 pm to provider Dr. Wilford Corner via secure text paging. Electronically Signed   By: Orvan Falconer M.D.   On: 06/11/2023 12:11   DG Foot Complete Left Result Date: 06/03/2023 Please see detailed  radiograph report in office note.   Micro Results     No results found for this or any previous visit (from the past 240 hours).  Today   Subjective    Shenna Brissette today has no headache,no chest abdominal pain,no new weakness tingling or numbness, feels much better wants to go home today.     Objective   Blood pressure 135/64, pulse 60, temperature 98.4 F (36.9 C), temperature source Oral, resp. rate 20, height 5\' 3"  (1.6 m), weight 77.4 kg, SpO2 97%.  No intake or output data in the 24 hours ending 06/12/23 1213  Exam  Awake Alert, No new F.N deficits,    [place].AT,PERRAL Supple Neck,   Symmetrical Chest wall movement, Good air movement bilaterally, CTAB RRR,No Gallops,   +ve B.Sounds, Abd Soft, Non tender,  No Cyanosis, Clubbing or edema    Data Review   Recent Labs  Lab 06/11/23 1148 06/11/23 1153 06/12/23 0610  WBC 4.0  --  3.4*  HGB 13.3 13.6 12.2  HCT 38.8 40.0 35.6*  PLT 245  --  220  MCV 95.3  --  94.7  MCH 32.7  --  32.4  MCHC 34.3  --  34.3  RDW 13.5  --  13.6  LYMPHSABS 1.3  --   --   MONOABS 0.4  --   --   EOSABS 0.0  --   --   BASOSABS 0.0  --   --     Recent Labs  Lab 06/11/23 1148 06/11/23 1153 06/11/23 1552 06/12/23 0610  NA 139 141  --  140  K 3.6 3.6  --  4.0  CL 106 108  --  106  CO2 20*  --   --  24  ANIONGAP 13  --   --  10  GLUCOSE 111* 109*  --  86  BUN 17 18  --  12  CREATININE 1.01* 1.00  --  0.86  AST 43*  --   --  27  ALT 45*  --   --  34  ALKPHOS 76  --   --  59  BILITOT 1.2  --   --  1.1  ALBUMIN 4.0  --   --  3.4*  INR 1.1  --   --   --   TSH  --   --  1.672  --   HGBA1C 5.2  --   --   --   MG  --   --  1.8 1.8  CALCIUM 9.1  --   --  8.9    Total Time in preparing paper work, data evaluation and todays exam - 35 minutes  Signature  -    Susa Raring M.D on 06/12/2023 at 12:13 PM   -  To page go to [email or web address]

## 2023-06-12 NOTE — Evaluation (Signed)
 Occupational Therapy Evaluation and DC Summary  Patient Details Name: Lydia Garcia MRN: [id number] DOB: 01-19-1941 Today's Date: 06/12/2023   History of Present Illness   Pt is an 83 yo female who presents to [facility] ED with c/o dizziness and aphasia. 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 No significant PMH.     Clinical Impressions Pt admitted for above, PTA pt lived alone and was ind in ADLs/iADLs and mobility no AD. Pt currently ambulatory in halls without DME + Supervision for safety, vision was [facility] with minor inconsistency of responses regarding RLQ, and completing ADLs with Supervision. Discussed with pt to follow-up with her eye doctor for a full visual field assessment, and making sure to pace herself at home if DC today given her elevated HR with activity. Pt has no further acute skilled OT needs, would benefit from further work with mobility team to remain active in acute stay. No post acute OT needs identified.      If plan is discharge home, recommend the following:   Other (comment) (prn)     Functional Status Assessment   Patient has not had a recent decline in their functional status     Equipment Recommendations   None recommended by OT     Recommendations for Other Services         Precautions/Restrictions   Precautions Precautions: Fall (low fall risk) Recall of Precautions/Restrictions: Intact Restrictions Weight Bearing Restrictions Per Provider Order: No     Mobility Bed Mobility Overal bed mobility: Modified Independent                  Transfers Overall transfer level: Needs assistance Equipment used: None Transfers: Sit to/from Stand Sit to Stand: Supervision           General transfer comment: Supervision for safety      Balance Overall balance assessment: Mild deficits observed, not formally tested                                          ADL either performed or assessed with clinical judgement   ADL Overall ADL's : Needs assistance/impaired Eating/Feeding: Independent;Sitting   Grooming: Standing;Supervision/safety   Upper Body Bathing: Supervision/ safety;Standing   Lower Body Bathing: Supervison/ safety;Sit to/from stand   Upper Body Dressing : Supervision/safety;Standing   Lower Body Dressing: Supervision/safety;Sit to/from stand   Toilet Transfer: Supervision/safety;Ambulation   Toileting- Clothing Manipulation and Hygiene: Supervision/safety;Sit to/from stand       Functional mobility during ADLs: Supervision/safety General ADL Comments: supervision assist for safety, Educated pt on BeFAST stroke education.     Vision Baseline Vision/History: 1 Wears glasses Patient Visual Report: No change from baseline Vision Assessment?: Yes Eye Alignment: Within Functional Limits Ocular Range of Motion: Within Functional Limits Alignment/Gaze Preference: Within Defined Limits Tracking/Visual Pursuits: Able to track stimulus in all quads without difficulty Visual Fields: No apparent deficits;Other (comment) Diplopia Assessment:  (n/a) Additional Comments: During field testing pt slow response of objects coming from RLQ but with additional assessment and testing of area her responses improved to [facility]. -1 on the Letter cancellation test, but pt with good ability to read and scan texts.     Perception Perception: Within Functional Limits       Praxis Praxis: [facility]       Pertinent Vitals/Pain Pain Assessment  Pain Assessment: No/denies pain     Extremity/Trunk Assessment Upper Extremity Assessment Upper Extremity Assessment: Overall WFL for tasks assessed   Lower Extremity Assessment Lower Extremity Assessment: Overall WFL for tasks assessed       Communication Communication Communication: No apparent difficulties   Cognition Arousal: Alert Behavior During Therapy: WFL  for tasks assessed/performed Cognition: No apparent impairments                               Following commands: Intact       Cueing  General Comments   Cueing Techniques: Verbal cues      Exercises     Shoulder Instructions      Home Living Family/patient expects to be discharged to:: Private residence Living Arrangements: Alone   Type of Home: House Home Access: Stairs to enter Secretary/administrator of Steps: 2 Entrance Stairs-Rails: None Home Layout: Two level;Bed/bath upstairs Alternate Level Stairs-Number of Steps: 7 Alternate Level Stairs-Rails: Right;Can reach both;Left Bathroom Shower/Tub: Chief Strategy Officer: Handicapped height     Home Equipment: None   Additional Comments: Has grab bars that are not installed      Prior Functioning/Environment Prior Level of Function : Independent/Modified Independent;Driving             Mobility Comments: Ind no AD ADLs Comments: ind    OT Problem List: Decreased knowledge of precautions   OT Treatment/Interventions:        OT Goals(Current goals can be found in the care plan section)   Acute Rehab OT Goals Patient Stated Goal: Go home OT Goal Formulation: With patient Time For Goal Achievement: 06/26/23 Potential to Achieve Goals: Good   OT Frequency:       Co-evaluation              AM-PAC OT "6 Clicks" Daily Activity     Outcome Measure Help from another person eating meals?: None Help from another person taking care of personal grooming?: A Little Help from another person toileting, which includes using toliet, bedpan, or urinal?: A Little Help from another person bathing (including washing, rinsing, drying)?: A Little Help from another person to put on and taking off regular upper body clothing?: A Little Help from another person to put on and taking off regular lower body clothing?: A Little 6 Click Score: 19   End of Session Equipment Utilized During  Treatment: Gait belt Nurse Communication: Mobility status  Activity Tolerance: Patient tolerated treatment well Patient left: in bed;with call bell/phone within reach;with family/visitor present  OT Visit Diagnosis: Cognitive communication deficit (R41.841) Symptoms and signs involving cognitive functions: 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

## 2023-06-12 NOTE — Progress Notes (Signed)
 ANTICOAGULATION CONSULT NOTE Pharmacy Consult for Heparin Indication: atrial fibrillation and stroke Brief A/P: Heparin level subtherapeutic Increase Heparin rate  No Known Allergies  Patient Measurements: Height: 5\' 3"  (160 cm) Weight: 77.4 kg (170 lb 10.2 oz) IBW/kg (Calculated) : 52.4 Heparin Dosing Weight: 69.1 kg  Vital Signs: Temp: 97.9 F (36.6 C) (02/22 0000) Temp Source: Oral (02/22 0000) BP: 131/64 (02/22 0000) Pulse Rate: 60 (02/22 0000)  Labs: Recent Labs    06/11/23 1148 06/11/23 1153 06/11/23 2322  HGB 13.3 13.6  --   HCT 38.8 40.0  --   PLT 245  --   --   APTT 34  --   --   LABPROT 14.5  --   --   INR 1.1  --   --   HEPARINUNFRC  --   --  0.25*  CREATININE 1.01* 1.00  --     Estimated Creatinine Clearance: 41.3 mL/min (by C-G formula based on SCr of 1 mg/dL).  History reviewed. No pertinent past medical history.  Assessment: 83 y.o. female with Afib and CVA for heparin  Goal of Therapy:  Heparin level 0.3-0.5 units/ml Monitor platelets by anticoagulation protocol: Yes   Plan:  Increase Heparin 1000 units/hr Check heparin level in 8 hours.   Geannie Risen, PharmD, BCPS

## 2023-06-12 NOTE — Progress Notes (Addendum)
 STROKE TEAM PROGRESS NOTE   INTERIM HISTORY/SUBJECTIVE Started eliquis today without any issues. Rate controlled with metoprolol  Neuro exam is improved with no dizziness or aphasia on exam  OBJECTIVE  CBC    Component Value Date/Time   WBC 3.4 (L) 06/12/2023 0610   RBC 3.76 (L) 06/12/2023 0610   HGB 12.2 06/12/2023 0610   HCT 35.6 (L) 06/12/2023 0610   PLT 220 06/12/2023 0610   MCV 94.7 06/12/2023 0610   MCH 32.4 06/12/2023 0610   MCHC 34.3 06/12/2023 0610   RDW 13.6 06/12/2023 0610   LYMPHSABS 1.3 06/11/2023 1148   MONOABS 0.4 06/11/2023 1148   EOSABS 0.0 06/11/2023 1148   BASOSABS 0.0 06/11/2023 1148    BMET    Component Value Date/Time   NA 140 06/12/2023 0610   K 4.0 06/12/2023 0610   CL 106 06/12/2023 0610   CO2 24 06/12/2023 0610   GLUCOSE 86 06/12/2023 0610   BUN 12 06/12/2023 0610   CREATININE 0.86 06/12/2023 0610   CALCIUM 8.9 06/12/2023 0610   GFRNONAA >60 06/12/2023 0610    IMAGING past 24 hours ECHOCARDIOGRAM COMPLETE Result Date: 06/11/2023    ECHOCARDIOGRAM REPORT   Patient Name:   Lydia Garcia Date of Exam: 06/11/2023 Medical Rec #:  [id number]        Height:       63.0 in Accession #:    [id number]       Weight:       170.6 lb Date of Birth:  Aug 30, 1940        BSA:          1.807 m Patient Age:    82 years         BP:           165/92 mmHg Patient Gender: F                HR:           80 bpm. Exam Location:  Inpatient Procedure: 2D Echo (Both Spectral and Color Flow Doppler were utilized during            procedure). Indications:    stroke  History:        Patient has no prior history of Echocardiogram examinations.                 Arrythmias:Atrial Fibrillation.  Sonographer:    [name] RDCS Referring Phys: [id number] DEVON SHAFER IMPRESSIONS  1. Left ventricular ejection fraction, by estimation, is 60 to 65%. The left ventricle has normal function. The left ventricle has no regional wall motion abnormalities. There is mild concentric left  ventricular hypertrophy. Left ventricular diastolic function could not be evaluated.  2. Right ventricular systolic function is normal. The right ventricular size is mildly enlarged. There is moderately elevated pulmonary artery systolic pressure. The estimated right ventricular systolic pressure is 59.1 mmHg.  3. Left atrial size was mild to moderately dilated.  4. Right atrial size was moderately dilated.  5. The mitral valve is normal in structure. Mild mitral valve regurgitation. No evidence of mitral stenosis.  6. Tricuspid valve regurgitation is severe.  7. The aortic valve is normal in structure. Aortic valve regurgitation is not visualized. No aortic stenosis is present.  8. The inferior vena cava is dilated in size with <50% respiratory variability, suggesting right atrial pressure of 15 mmHg. FINDINGS  Left Ventricle: Left ventricular ejection fraction, by estimation, is 60 to 65%. The left ventricle has normal function.  The left ventricle has no regional wall motion abnormalities. Strain imaging was not performed. The left ventricular internal cavity  size was normal in size. There is mild concentric left ventricular hypertrophy. Left ventricular diastolic function could not be evaluated due to atrial fibrillation. Left ventricular diastolic function could not be evaluated. Right Ventricle: The right ventricular size is mildly enlarged. No increase in right ventricular wall thickness. Right ventricular systolic function is normal. There is moderately elevated pulmonary artery systolic pressure. The tricuspid regurgitant velocity is 3.32 m/s, and with an assumed right atrial pressure of 15 mmHg, the estimated right ventricular systolic pressure is 59.1 mmHg. Left Atrium: Left atrial size was mild to moderately dilated. Right Atrium: Right atrial size was moderately dilated. Pericardium: There is no evidence of pericardial effusion. Mitral Valve: The mitral valve is normal in structure. Mild mitral valve  regurgitation. No evidence of mitral valve stenosis. Tricuspid Valve: The tricuspid valve is normal in structure. Tricuspid valve regurgitation is severe. No evidence of tricuspid stenosis. Aortic Valve: The aortic valve is normal in structure. Aortic valve regurgitation is not visualized. No aortic stenosis is present. Pulmonic Valve: The pulmonic valve was normal in structure. Pulmonic valve regurgitation is not visualized. No evidence of pulmonic stenosis. Aorta: The aortic root is normal in size and structure. Venous: The inferior vena cava is dilated in size with less than 50% respiratory variability, suggesting right atrial pressure of 15 mmHg. IAS/Shunts: No atrial level shunt detected by color flow Doppler. Additional Comments: 3D imaging was not performed.  LEFT VENTRICLE PLAX 2D LVIDd:         4.20 cm LVIDs:         2.60 cm LV PW:         1.00 cm LV IVS:        1.20 cm LVOT diam:     1.80 cm LVOT Area:     2.54 cm  RIGHT VENTRICLE             IVC RV Basal diam:  2.70 cm     IVC diam: 2.10 cm RV S prime:     10.10 cm/s TAPSE (M-mode): 1.5 cm LEFT ATRIUM           Index        RIGHT ATRIUM           Index LA diam:      4.20 cm 2.32 cm/m   RA Area:     17.50 cm LA Vol (A2C): 32.6 ml 18.04 ml/m  RA Volume:   41.00 ml  22.68 ml/m LA Vol (A4C): 70.3 ml 38.89 ml/m   AORTA Ao Root diam: 2.70 cm Ao Asc diam:  3.00 cm TRICUSPID VALVE TR Peak grad:   44.1 mmHg TR Vmax:        332.00 cm/s  SHUNTS Systemic Diam: 1.80 cm Arvilla Meres MD Electronically signed by Arvilla Meres MD Signature Date/Time: 06/11/2023/5:08:06 PM    Final    MR BRAIN WO CONTRAST Result Date: 06/11/2023 CLINICAL DATA:  Neuro deficit, acute, stroke suspected. EXAM: MRI HEAD WITHOUT CONTRAST TECHNIQUE: Multiplanar, multiecho pulse sequences of the brain and surrounding structures were obtained without intravenous contrast. COMPARISON:  Head CT and CTA head/neck 06/11/2023. FINDINGS: Brain: Acute subcortical infarct along the left  temporoparietal junction, likely involving the left optic radiations and arcuate fasciculus. No acute hemorrhage or mass effect. Focal susceptibility in the posterior aspect of the left sylvian fissure likely corresponds to the vessel occlusion seen on same day  CTA. Hydrocephalus or extra-axial collection. No mass or midline shift. Vascular: As above.  Otherwise normal flow voids. Skull and upper cervical spine: Normal marrow signal. Sinuses/Orbits: No acute findings. Other: None. IMPRESSION: 1. 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 No acute hemorrhage or mass effect. 2. Focal susceptibility in the posterior aspect of the left Sylvian fissure likely corresponds to the vessel occlusion seen on same day CTA. Electronically Signed   By: Orvan Falconer M.D.   On: 06/11/2023 14:27    Vitals:   06/11/23 2022 06/12/23 0000 06/12/23 0400 06/12/23 0940  BP: 129/71 131/64 113/64 135/64  Pulse:  60 60   Resp: (!) 21 20 20 20   Temp:  97.9 F (36.6 C) 97.7 F (36.5 C) 98.4 F (36.9 C)  TempSrc:  Oral Oral Oral  SpO2:    97%  Weight:      Height:         PHYSICAL EXAM General:  Alert, well-nourished, well-developed patient in no acute distress Psych:  Mood and affect appropriate for situation CV: Regular rate and rhythm on monitor Respiratory:  Regular, unlabored respirations on room air GI: Abdomen soft and nontender   NEURO:  Mental Status: AA&Ox3, patient is able to give clear and coherent history Speech/Language: speech is without dysarthria or aphasia.  Naming, repetition, fluency, and comprehension intact.  Cranial Nerves:  II: PERRL. Visual fields full.  III, IV, VI: EOMI. Eyelids elevate symmetrically.  V: Sensation is intact to light touch and symmetrical to face.  VII: Face is symmetrical resting and smiling VIII: hearing intact to voice. IX, X: Palate elevates symmetrically. Phonation is  normal.  GN:FAOZHYQM shrug 5/5. XII: tongue is midline without fasciculations. Motor: 5/5 strength to all muscle groups tested.  Tone: is normal and bulk is normal Sensation- Intact to light touch bilaterally. Extinction absent to light touch to DSS.   Coordination: FTN intact bilaterally, HKS: no ataxia in BLE.No drift.  Gait- deferred  Most Recent NIH  1a Level of Conscious.: 0 1b LOC Questions: 0 1c LOC Commands: 0 2 Best Gaze: 0 3 Visual: 0 4 Facial Palsy: 0 5a Motor Arm - left: 0 5b Motor Arm - Right: 0 6a Motor Leg - Left: 0 6b Motor Leg - Right: 0 7 Limb Ataxia: 0 8 Sensory: 0 9 Best Language: 0 10 Dysarthria: 0 11 Extinct. and Inatten.: 0 TOTAL: 0   ASSESSMENT/PLAN  [name] is a 83 y.o. female with no significant PMH admitted for aphasia and dizziness.  NIH on Admission 1. Appears to be in afib on monitor, confirmed by EKG.  Acute Ischemic Infarct:  left temporoparietal junction  Etiology:  cardio embolic in the setting of new diagnosed afib  Code Stroke CT head No acute abnormality. ASPECTS 10.    CTA head & neck A distal M2 left middle cerebral artery vessel is poorly delineated, suspicious for vessel occlusion. CT perfusion no core infarct MRI  Acute subcortical infarct along the left temporoparietal junction, likely involving the left optic radiations and arcuate fasciculus. No acute hemorrhage or mass effect. 2D Echo EF 60-65%, LA mild to moderately dilated  LDL 46 HgbA1c 5.2 VTE prophylaxis - Eliquis  No antithrombotic prior to admission, now on Eliquis (apixaban) daily  Therapy recommendations:  No follow up needed  Disposition:  Home  Follow up with [facility] outpatient in 8 weeks  Atrial fibrillation New diagnosis  Continue telemetry monitoring Begin anticoagulation with Elqius  Rate control with metoprolol Follow up with afib clinic outpatient   Other Stroke Risk Factors Obesity, Body mass index is 30.23 kg/m., BMI >/= 30 associated  with increased stroke risk, recommend weight loss, diet and exercise as appropriate   Hospital day # 0   5:56 PM    Gevena Mart DNP, ACNPC-AG  [facility]    I, the attending neurologist, have personally obtained a history, examined the patient, evaluated laboratory data, individually viewed imaging studies and agree with radiology interpretations. I obtained additional history from pt's and family at bedside. Together with the NP/PA, we formulated the assessment and plan of care which reflects our mutual decision.  I have made any additions or clarifications directly to the above note and agree with the findings and plan as currently documented. She will follow up with outpatient neurology in 8 weeks.     Windell Norfolk, MD  Neurology 06/12/2023   To contact Stroke Continuity provider, please refer to [email or web address]. After hours, contact General Neurology

## 2023-06-12 NOTE — Progress Notes (Signed)
 ANTICOAGULATION CONSULT NOTE  Pharmacy Consult for Heparin Indication: atrial fibrillation and stroke  No Known Allergies  Patient Measurements: Height: 5\' 3"  (160 cm) Weight: 77.4 kg (170 lb 10.2 oz) IBW/kg (Calculated) : 52.4 Heparin Dosing Weight: 69.1 kg  Vital Signs: Temp: 98.4 F (36.9 C) (02/22 0940) Temp Source: Oral (02/22 0940) BP: 135/64 (02/22 0940) Pulse Rate: 60 (02/22 0400)  Labs: Recent Labs    06/11/23 1148 06/11/23 1153 06/11/23 2322 06/12/23 0610 06/12/23 1002  HGB 13.3 13.6  --  12.2  --   HCT 38.8 40.0  --  35.6*  --   PLT 245  --   --  220  --   APTT 34  --   --   --   --   LABPROT 14.5  --   --   --   --   INR 1.1  --   --   --   --   HEPARINUNFRC  --   --  0.25*  --  0.81*  CREATININE 1.01* 1.00  --  0.86  --     Estimated Creatinine Clearance: 48 mL/min (by C-G formula based on SCr of 0.86 mg/dL).   Medical History: History reviewed. No pertinent past medical history.  Assessment: 83 yof without significant history. Patient is presenting with dizziness and aphasia. Neurology work up concerning for CTA w/ suspicion for distal M2 occlusion. MRI also w/ infarction seen. Heparin per pharmacy consult placed for new onset atrial fibrillation and stroke.  Patient is not on anticoagulation prior to arrival.  2/22: Transitioning patient to Eliquis for MD request. Heparin level this morning was supratherapeutic at 0.81, no signs of bleeding per RN. CBC stable (Hgb 12.2, PLT 220).   Goal of Therapy:  Heparin level 0.3-0.5 units/ml Monitor platelets by anticoagulation protocol: Yes   Plan:  Eliquis 5 mg PO BID (Age >80 YO, weight >60 kg, Scr 0.86) Monitor for any signs of bleeding  Enos Fling, PharmD PGY-1 Acute Care Pharmacy Resident 06/12/2023 11:45 AM

## 2023-06-12 NOTE — Discharge Instructions (Addendum)
 Follow with Primary MD [name], NP in 7 days   Get CBC, CMP, 2 view Chest X ray -  checked next visit with your primary MD   Activity: As tolerated with Full fall precautions use walker/cane & assistance as needed  Disposition Home     Diet: Heart Healthy   Special Instructions: If you have smoked or chewed Tobacco  in the last 2 yrs please stop smoking, stop any regular Alcohol  and or any Recreational drug use.  On your next visit with your primary care physician please Get Medicines reviewed and adjusted.  Please request your Prim.MD to go over all Hospital Tests and Procedure/Radiological results at the follow up, please get all Hospital records sent to your Prim MD by signing hospital release before you go home.  If you experience worsening of your admission symptoms, develop shortness of breath, life threatening emergency, suicidal or homicidal thoughts you must seek medical attention immediately by calling 911 or calling your MD immediately  if symptoms less severe.  You Must read complete instructions/literature along with all the possible adverse reactions/side effects for all the Medicines you take and that have been prescribed to you. Take any new Medicines after you have completely understood and accpet all the possible adverse reactions/side effects.   Do not drive when taking Pain medications.  Do not take more than prescribed Pain, Sleep and Anxiety Medications   Information on my medicine - ELIQUIS (apixaban)  This medication education was reviewed with me or my healthcare representative as part of my discharge preparation.  Why was Eliquis prescribed for you? Eliquis was prescribed for you to reduce the risk of forming blood clots that can cause a stroke if you have a medical condition called atrial fibrillation (a type of irregular heartbeat) OR to reduce the risk of a blood clots forming after orthopedic surgery.  What do You need to know about Eliquis ? Take  your Eliquis TWICE DAILY - one tablet in the morning and one tablet in the evening with or without food.  It would be best to take the doses about the same time each day.  If you have difficulty swallowing the tablet whole please discuss with your pharmacist how to take the medication safely.  Take Eliquis exactly as prescribed by your doctor and DO NOT stop taking Eliquis without talking to the doctor who prescribed the medication.  Stopping may increase your risk of developing a new clot or stroke.  Refill your prescription before you run out.  After discharge, you should have regular check-up appointments with your healthcare provider that is prescribing your Eliquis.  In the future your dose may need to be changed if your kidney function or weight changes by a significant amount or as you get older.  What do you do if you miss a dose? If you miss a dose, take it as soon as you remember on the same day and resume taking twice daily.  Do not take more than one dose of ELIQUIS at the same time.  Important Safety Information A possible side effect of Eliquis is bleeding. You should call your healthcare provider right away if you experience any of the following: Bleeding from an injury or your nose that does not stop. Unusual colored urine (red or dark brown) or unusual colored stools (red or black). Unusual bruising for unknown reasons. A serious fall or if you hit your head (even if there is no bleeding).  Some medicines may interact with  Eliquis and might increase your risk of bleeding or clotting while on Eliquis. To help avoid this, consult your healthcare provider or pharmacist prior to using any new prescription or non-prescription medications, including herbals, vitamins, non-steroidal anti-inflammatory drugs (NSAIDs) and supplements.  This website has more information on Eliquis (apixaban): [email or web address].cy.

## 2023-06-12 NOTE — Progress Notes (Signed)
 Physical Therapy Note  Spoke with occupational therapy after their initial evaluation. OT reports patient is functioning at a high level of independence and no physical therapy is indicated at this time. Walks with good pace and navigates stairs safely. PT is signing-off. Please re-order if there is any significant change in status. Thank you for this referral.  Kathlyn Sacramento, PT, DPT [facility]  Rehabilitation Services Physical Therapist Office: [phone] Website: [email or web address]

## 2023-06-24 ENCOUNTER — Ambulatory Visit (HOSPITAL_COMMUNITY)
Admission: RE | Admit: 2023-06-24 | Discharge: 2023-06-24 | Disposition: A | Discharge status: Home / Self Care | Payer: Medicare PPO | Source: Ambulatory Visit | Attending: Physician Assistant | Admitting: Physician Assistant

## 2023-06-24 ENCOUNTER — Encounter (HOSPITAL_COMMUNITY): Payer: Self-pay | Admitting: Physician Assistant

## 2023-06-24 VITALS — BP 134/92 | HR 72 | Ht 63.0 in | Wt 169.0 lb

## 2023-06-24 DIAGNOSIS — I4891 Unspecified atrial fibrillation: Secondary | ICD-10-CM | POA: Diagnosis present

## 2023-06-24 DIAGNOSIS — Z7901 Long term (current) use of anticoagulants: Secondary | ICD-10-CM | POA: Diagnosis not present

## 2023-06-24 DIAGNOSIS — I4819 Other persistent atrial fibrillation: Secondary | ICD-10-CM | POA: Insufficient documentation

## 2023-06-24 DIAGNOSIS — D6869 Other thrombophilia: Secondary | ICD-10-CM | POA: Insufficient documentation

## 2023-06-24 DIAGNOSIS — Z8673 Personal history of transient ischemic attack (TIA), and cerebral infarction without residual deficits: Secondary | ICD-10-CM | POA: Diagnosis not present

## 2023-06-24 HISTORY — DX: Cerebral infarction, unspecified: I63.9

## 2023-06-24 HISTORY — DX: Unspecified atrial fibrillation: I48.91

## 2023-06-24 MED ORDER — METOPROLOL TARTRATE 25 MG PO TABS: 12.5000 mg | ORAL_TABLET | Freq: Two times a day (BID) | ORAL | 3 refills | Status: DC

## 2023-06-24 MED ORDER — APIXABAN 5 MG PO TABS: 5.0000 mg | ORAL_TABLET | Freq: Two times a day (BID) | ORAL | 3 refills | Status: AC

## 2023-06-24 NOTE — Progress Notes (Signed)
 Primary Care Physician: [name], NP Primary Cardiologist: None Electrophysiologist: None  Referring Physician: ED   Lydia Garcia is a 83 y.o. female with a history of CVA and atrial fibrillation who presents for consultation in the [facility].  The patient presented to the ED 06/11/23 with sudden onset aphasia and dizziness and was found to have an acute subcortical infarct along the left temporoparietal junction. ECG showed new onset atrial fibrillation. Patient was started on Eliquis for stroke prevention and metoprolol for rate control. Echo showed normal EF 60-65%, moderately elevated pulmonary pressure, moderately dilated LA, mild MR.   Today, patient remains in rate controlled afib. She feels well and is unaware of her arrhythmia. She states that she has had an "irregular heart beat all her life". She did have some dizziness on the full dose of metoprolol and this was decreased to half a pill.   Today, she denies symptoms of palpitations, chest pain, shortness of breath, orthopnea, PND, lower extremity edema, dizziness, presyncope, syncope, snoring, daytime somnolence, bleeding, or neurologic sequela. The patient is tolerating medications without difficulties and is otherwise without complaint today.    Atrial Fibrillation Risk Factors:  she does not have symptoms or diagnosis of sleep apnea. she does not have a history of rheumatic fever. she does not have a history of alcohol use.   Atrial Fibrillation Management history:  Previous antiarrhythmic drugs: none Previous cardioversions: none Previous ablations: none Anticoagulation history: Eliquis  ROS- All systems are reviewed and negative except as per the HPI above.  Past Medical History:  Diagnosis Date   Atrial fibrillation (HCC)    CVA (cerebral vascular accident) [facility])     Current Outpatient Medications  Medication Sig Dispense Refill   cholecalciferol (VITAMIN D3) 25 MCG (1000  UNIT) tablet Take 1,000 Units by mouth daily.     Multiple Vitamin (MULTIVITAMIN WITH MINERALS) TABS tablet Take 1 tablet by mouth daily.     apixaban (ELIQUIS) 5 MG TABS tablet Take 1 tablet (5 mg total) by mouth 2 (two) times daily. 60 tablet 0   metoprolol tartrate (LOPRESSOR) 25 MG tablet Take 1 tablet (25 mg total) by mouth 2 (two) times daily. (Patient taking differently: Take 12.5 mg by mouth 2 (two) times daily.) 60 tablet 0   No current facility-administered medications for this encounter.    Physical Exam: BP (!) 134/92   Pulse 72   Ht 5\' 3"  (1.6 m)   Wt 76.7 kg   BMI 29.94 kg/m   GEN: Well nourished, well developed in no acute distress NECK: No JVD; No carotid bruits CARDIAC: Irregularly irregular rate and rhythm, no murmurs, rubs, gallops RESPIRATORY:  Clear to auscultation without rales, wheezing or rhonchi  ABDOMEN: Soft, non-tender, non-distended EXTREMITIES:  No edema; No deformity   Wt Readings from Last 3 Encounters:  06/24/23 76.7 kg  06/11/23 77.4 kg     EKG today demonstrates  Afib Vent. rate 72 BPM PR interval * ms QRS duration 66 ms QT/QTcB 322/352 ms   Echo 06/11/23 demonstrated   1. Left ventricular ejection fraction, by estimation, is 60 to 65%. The  left ventricle has normal function. The left ventricle has no regional  wall motion abnormalities. There is mild concentric left ventricular  hypertrophy. Left ventricular diastolic function could not be evaluated.   2. Right ventricular systolic function is normal. The right ventricular  size is mildly enlarged. There is moderately elevated pulmonary artery  systolic pressure. The estimated right ventricular  systolic pressure is  59.1 mmHg.   3. Left atrial size was mild to moderately dilated.   4. Right atrial size was moderately dilated.   5. The mitral valve is normal in structure. Mild mitral valve  regurgitation. No evidence of mitral stenosis.   6. Tricuspid valve regurgitation is severe.    7. The aortic valve is normal in structure. Aortic valve regurgitation is  not visualized. No aortic stenosis is present.   8. The inferior vena cava is dilated in size with <50% respiratory  variability, suggesting right atrial pressure of 15 mmHg.    CHA2DS2-VASc Score = 5  The patient's score is based upon: CHF History: 0 HTN History: 0 Diabetes History: 0 Stroke History: 2 Vascular Disease History: 0 Age Score: 2 Gender Score: 1       ASSESSMENT AND PLAN: Persistent Atrial Fibrillation (ICD10:  I48.19) The patient's CHA2DS2-VASc score is 5, indicating a 7.2% annual risk of stroke.   General education about afib provided and questions answered. We also discussed her stroke risk and the risks and benefits of anticoagulation. We discussed rate vs rhythm control. Patient reports that she has always had an irregular heart beat. Echo showed moderately dilated LA. Possible her afib is longstanding. She would like to pursue a conservative rate control strategy given her age, paucity of symptoms, good rate control, and preserved EF.  Continue Eliquis 5 mg BID Continue Lopressor 12.5 mg BID  Secondary Hypercoagulable State (ICD10:  D68.69) The patient is at significant risk for stroke/thromboembolism based upon her CHA2DS2-VASc Score of 5.  Continue Apixaban (Eliquis). No bleeding issues.    Follow up in the AF clinic in 3 months.        Jorja Loa PA-C [facility] [address] [place], [place] [address] [phone]

## 2023-08-10 ENCOUNTER — Other Ambulatory Visit (HOSPITAL_COMMUNITY): Payer: Self-pay

## 2023-08-19 ENCOUNTER — Ambulatory Visit: Payer: Medicare PPO | Admitting: Neurology

## 2023-08-19 ENCOUNTER — Encounter: Payer: Self-pay | Admitting: Neurology

## 2023-08-19 VITALS — BP 125/69 | HR 87 | Ht 63.0 in | Wt 172.0 lb

## 2023-08-19 DIAGNOSIS — I4891 Unspecified atrial fibrillation: Secondary | ICD-10-CM

## 2023-08-19 DIAGNOSIS — I63412 Cerebral infarction due to embolism of left middle cerebral artery: Secondary | ICD-10-CM

## 2023-08-19 DIAGNOSIS — R4701 Aphasia: Secondary | ICD-10-CM

## 2023-08-19 NOTE — Progress Notes (Signed)
 [facility] [address] [place]. [place] [address] [phone]       OFFICE CONSULT NOTE  [name] Date of Birth:  1940-07-24 Medical Record Number:  [id number]   Referring MD: Imogene Mana, NP  Reason for Referral: Stroke  HPI: Lydia Garcia is a pleasant 83 year old African-American lady seen today for initial office consultation visit for stroke.  History is obtained from the patient and review of electronic medical records.  I personally reviewed pertinent available imaging films in PACS.  She presented on 06/11/2023 for evaluation for sudden onset of speech difficulties and dizziness.  She was fine the night before and next morning when she woke up and started cleaning her house she started feeling somewhat dizzy.  She called her daughter at 81 AM and apparently her speech was normal.  When the daughter called her back shortly thereafter the patient's speech was gibberish and daughter was not able to understand her.  Patient was found to have mild aphasia on arrival.  Stat CT head was unremarkable for acute abnormalities.  CT angiogram of the head and neck along with perfusion showed no large vessel occlusion or any penumbra.  MRI brain however showed as small left temporoparietal subcortical infarct.  Patient was found to be in new onset atrial fibrillation on EKG monitor.  2D echo showed ejection fraction of 60 to 65% with left atrium mild to moderately dilated.  LDL cholesterol 46 mg percent.  Hemoglobin A1c was 5.2.  Patient was started on Eliquis  speech resolved completely by the time of discharge.  She states she has done well.  She has had no recurring stroke or TIA symptoms.  She is tolerating Eliquis  well without bruising or bleeding.  She has been started on metoprolol  for rate control and has followed up with cardiology clinic.  She has no complaints today.  She denies any prior history of strokes TIA seizures or significant neurological problems.  She  states she has had palpitations in the past but was never diagnosed with A-fib but given left atrial dilatation on echo chances are that her A-fib is likely longstanding though it was diagnosed recently.  ROS:   14 system review of systems is positive for speech difficulties, dizziness all other systems negative  PMH:  Past Medical History:  Diagnosis Date   Atrial fibrillation (HCC)    CVA (cerebral vascular accident) (HCC)     Social History:  Social History   Socioeconomic History   Marital status: Married    Spouse name: Not on file   Number of children: Not on file   Years of education: Not on file   Highest education level: Not on file  Occupational History   Not on file  Tobacco Use   Smoking status: Never   Smokeless tobacco: Never   Tobacco comments:    Never smoked 06/24/23  Vaping Use   Vaping status: Never Used  Substance and Sexual Activity   Alcohol use: Never   Drug use: Never   Sexual activity: Not on file  Other Topics Concern   Not on file  Social History Narrative   Not on file   Social Drivers of Health   Financial Resource Strain: Not on file  Food Insecurity: No Food Insecurity (06/11/2023)   Hunger Vital Sign    Worried About Running Out of Food in the Last Year: Never true    Ran Out of Food in the Last Year: Never true  Transportation Needs: No Transportation Needs (  06/11/2023)   PRAPARE - Administrator, Civil Service (Medical): No    Lack of Transportation (Non-Medical): No  Physical Activity: Not on file  Stress: Not on file  Social Connections: Moderately Integrated (06/11/2023)   Social Connection and Isolation Panel [NHANES]    Frequency of Communication with Friends and Family: More than three times a week    Frequency of Social Gatherings with Friends and Family: Three times a week    Attends Religious Services: More than 4 times per year    Active Member of Clubs or Organizations: Yes    Attends Banker  Meetings: More than 4 times per year    Marital Status: Widowed  Intimate Partner Violence: Not At Risk (06/11/2023)   Humiliation, Afraid, Rape, and Kick questionnaire    Fear of Current or Ex-Partner: No    Emotionally Abused: No    Physically Abused: No    Sexually Abused: No    Medications:   Current Outpatient Medications on File Prior to Visit  Medication Sig Dispense Refill   apixaban  (ELIQUIS ) 5 MG TABS tablet Take 1 tablet (5 mg total) by mouth 2 (two) times daily. 60 tablet 3   cholecalciferol (VITAMIN D3) 25 MCG (1000 UNIT) tablet Take 1,000 Units by mouth daily.     furosemide (LASIX) 20 MG tablet Take 20 mg by mouth daily.     metoprolol  tartrate (LOPRESSOR ) 25 MG tablet Take 0.5 tablets (12.5 mg total) by mouth 2 (two) times daily. 30 tablet 3   Multiple Vitamin (MULTIVITAMIN WITH MINERALS) TABS tablet Take 1 tablet by mouth daily.     No current facility-administered medications on file prior to visit.    Allergies:  No Known Allergies  Physical Exam General: well developed, well nourished pleasant elderly African-American lady, seated, in no evident distress Head: head normocephalic and atraumatic.   Neck: supple with no carotid or supraclavicular bruits Cardiovascular: irregular rate and rhythm-atrial fibrillation, no murmurs Musculoskeletal: no deformity Skin:  no rash/petichiae Vascular:  Normal pulses all extremities  Neurologic Exam Mental Status: Awake and fully alert. Oriented to place and time. Recent and remote memory intact. Attention span, concentration and fund of knowledge appropriate. Mood and affect appropriate.  Cranial Nerves: Fundoscopic exam reveals sharp disc margins. Pupils equal, briskly reactive to light. Extraocular movements full without nystagmus. Visual fields full to confrontation. Hearing intact. Facial sensation intact. Face, tongue, palate moves normally and symmetrically.  Motor: Normal bulk and tone. Normal strength in all tested  extremity muscles. Sensory.: intact to touch , pinprick , position and vibratory sensation.  Coordination: Rapid alternating movements normal in all extremities. Finger-to-nose and heel-to-shin performed accurately bilaterally. Gait and Station: Arises from chair without difficulty. Stance is normal. Gait demonstrates normal stride length and balance . Able to heel, toe and tandem walk with mild difficulty.  Reflexes: 1+ and symmetric. Toes downgoing.   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  Patient has done well with no residual deficits.  No significant vascular risk factors except atrial fibrillation and age     PLAN:I had a long d/w patient about her recent embolic stroke,new onset atrial fibrillation, risk for recurrent stroke/TIAs, personally independently reviewed imaging studies and stroke evaluation results and answered questions.Continue Eliquis  (apixaban ) 5 mg twice daily  for secondary stroke prevention and maintain strict control of hypertension with blood pressure goal  below 130/90, diabetes with hemoglobin A1c goal below 6.5% and lipids with LDL cholesterol goal below 70 mg/dL. I also advised the patient to eat a healthy diet with plenty of whole grains, cereals, fruits and vegetables, exercise regularly and maintain ideal body weight Followup in the future with me in  6 months or call earlier if needed.  Greater than 50% time during this 45 minutes consultation visit was spent in council and involved stroke and atrial fibrillation and discussion about stroke evaluation, prevention and treatment and answering questions on counseling and coordination of care about her atrial fibrillation and strokes and discussion about evaluation, prevention and treatment.  Ardella Beaver, MD Note: This document was prepared with digital dictation and possible smart phrase  technology. Any transcriptional errors that result from this process are unintentional.

## 2023-08-19 NOTE — Patient Instructions (Signed)
 I had a long d/w patient about her recent embolic stroke,new onset atrial fibrillation, risk for recurrent stroke/TIAs, personally independently reviewed imaging studies and stroke evaluation results and answered questions.Continue Eliquis  (apixaban ) 5 mg twice daily  for secondary stroke prevention and maintain strict control of hypertension with blood pressure goal below 130/90, diabetes with hemoglobin A1c goal below 6.5% and lipids with LDL cholesterol goal below 70 mg/dL. I also advised the patient to eat a healthy diet with plenty of whole grains, cereals, fruits and vegetables, exercise regularly and maintain ideal body weight Followup in the future with me in  6 months or call earlier if needed.  Stroke Prevention Some medical conditions and behaviors can lead to a higher chance of having a stroke. You can help prevent a stroke by eating healthy, exercising, not smoking, and managing any medical conditions you have. Stroke is a leading cause of functional impairment. Primary prevention is particularly important because a majority of strokes are first-time events. Stroke changes the lives of not only those who experience a stroke but also their family and other caregivers. How can this condition affect me? A stroke is a medical emergency and should be treated right away. A stroke can lead to brain damage and can sometimes be life-threatening. If a person gets medical treatment right away, there is a better chance of surviving and recovering from a stroke. What can increase my risk? The following medical conditions may increase your risk of a stroke: Cardiovascular disease. High blood pressure (hypertension). Diabetes. High cholesterol. Sickle cell disease. Blood clotting disorders (hypercoagulable state). Obesity. Sleep disorders (obstructive sleep apnea). Other risk factors include: Being older than age 17. Having a history of blood clots, stroke, or mini-stroke (transient ischemic attack,  TIA). Genetic factors, such as race, ethnicity, or a family history of stroke. Smoking cigarettes or using other tobacco products. Taking birth control pills, especially if you also use tobacco. Heavy use of alcohol or drugs, especially cocaine and methamphetamine. Physical inactivity. What actions can I take to prevent this? Manage your health conditions High cholesterol levels. Eating a healthy diet is important for preventing high cholesterol. If cholesterol cannot be managed through diet alone, you may need to take medicines. Take any prescribed medicines to control your cholesterol as told by your health care provider. Hypertension. To reduce your risk of stroke, try to keep your blood pressure below 130/80. Eating a healthy diet and exercising regularly are important for controlling blood pressure. If these steps are not enough to manage your blood pressure, you may need to take medicines. Take any prescribed medicines to control hypertension as told by your health care provider. Ask your health care provider if you should monitor your blood pressure at home. Have your blood pressure checked every year, even if your blood pressure is normal. Blood pressure increases with age and some medical conditions. Diabetes. Eating a healthy diet and exercising regularly are important parts of managing your blood sugar (glucose). If your blood sugar cannot be managed through diet and exercise, you may need to take medicines. Take any prescribed medicines to control your diabetes as told by your health care provider. Get evaluated for obstructive sleep apnea. Talk to your health care provider about getting a sleep evaluation if you snore a lot or have excessive sleepiness. Make sure that any other medical conditions you have, such as atrial fibrillation or atherosclerosis, are managed. Nutrition Follow instructions from your health care provider about what to eat or drink to help manage your  health  condition. These instructions may include: Reducing your daily calorie intake. Limiting how much salt (sodium) you use to 1,500 milligrams (mg) each day. Using only healthy fats for cooking, such as olive oil, canola oil, or sunflower oil. Eating healthy foods. You can do this by: Choosing foods that are high in fiber, such as whole grains, and fresh fruits and vegetables. Eating at least 5 servings of fruits and vegetables a day. Try to fill one-half of your plate with fruits and vegetables at each meal. Choosing lean protein foods, such as lean cuts of meat, poultry without skin, fish, tofu, beans, and nuts. Eating low-fat dairy products. Avoiding foods that are high in sodium. This can help lower blood pressure. Avoiding foods that have saturated fat, trans fat, and cholesterol. This can help prevent high cholesterol. Avoiding processed and prepared foods. Counting your daily carbohydrate intake.  Lifestyle If you drink alcohol: Limit how much you have to: 0-1 drink a day for women who are not pregnant. 0-2 drinks a day for men. Know how much alcohol is in your drink. In the [place] one drink equals one 12 oz bottle of beer ( ), one 5 oz glass of wine ( ), or one 1 oz glass of hard liquor (44mL). Do not use any products that contain nicotine or tobacco. These products include cigarettes, chewing tobacco, and vaping devices, such as e-cigarettes. If you need help quitting, ask your health care provider. Avoid secondhand smoke. Do not use drugs. Activity  Try to stay at a healthy weight. Get at least 30 minutes of exercise on most days, such as: Fast walking. Biking. Swimming. Medicines Take over-the-counter and prescription medicines only as told by your health care provider. Aspirin or blood thinners (antiplatelets or anticoagulants) may be recommended to reduce your risk of forming blood clots that can lead to stroke. Avoid taking birth control pills. Talk to your health  care provider about the risks of taking birth control pills if: You are over 73 years old. You smoke. You get very bad headaches. You have had a blood clot. Where to find more information American Stroke Association: www.strokeassociation.org Get help right away if: You or a loved one has any symptoms of a stroke. "BE FAST" is an easy way to remember the main warning signs of a stroke: B - Balance. Signs are dizziness, sudden trouble walking, or loss of balance. E - Eyes. Signs are trouble seeing or a sudden change in vision. F - Face. Signs are sudden weakness or numbness of the face, or the face or eyelid drooping on one side. A - Arms. Signs are weakness or numbness in an arm. This happens suddenly and usually on one side of the body. S - Speech. Signs are sudden trouble speaking, slurred speech, or trouble understanding what people say. T - Time. Time to call emergency services. Write down what time symptoms started. You or a loved one has other signs of a stroke, such as: A sudden, severe headache with no known cause. Nausea or vomiting. Seizure. These symptoms may represent a serious problem that is an emergency. Do not wait to see if the symptoms will go away. Get medical help right away. Call your local emergency services (911 in the [place] Do not drive yourself to the hospital. Summary You can help to prevent a stroke by eating healthy, exercising, not smoking, limiting alcohol intake, and managing any medical conditions you may have. Do not use any products that contain nicotine or tobacco. These include  cigarettes, chewing tobacco, and vaping devices, such as e-cigarettes. If you need help quitting, ask your health care provider. Remember "BE FAST" for warning signs of a stroke. Get help right away if you or a loved one has any of these signs. This information is not intended to replace advice given to you by your health care provider. Make sure you discuss any questions you have  with your health care provider. Document Revised: 03/09/2022 Document Reviewed: 03/09/2022 Elsevier Patient Education  2024 [employer].

## 2023-08-27 ENCOUNTER — Other Ambulatory Visit: Payer: Self-pay | Admitting: Registered Nurse

## 2023-08-27 DIAGNOSIS — M7989 Other specified soft tissue disorders: Secondary | ICD-10-CM

## 2023-09-02 ENCOUNTER — Ambulatory Visit
Admission: RE | Admit: 2023-09-02 | Discharge: 2023-09-02 | Disposition: A | Discharge status: Home / Self Care | Source: Ambulatory Visit | Attending: Registered Nurse | Admitting: Registered Nurse

## 2023-09-02 ENCOUNTER — Encounter: Payer: Self-pay | Admitting: Registered Nurse

## 2023-09-02 DIAGNOSIS — M7989 Other specified soft tissue disorders: Secondary | ICD-10-CM

## 2023-09-28 ENCOUNTER — Encounter (HOSPITAL_COMMUNITY): Payer: Self-pay | Admitting: Physician Assistant

## 2023-09-28 ENCOUNTER — Ambulatory Visit (HOSPITAL_COMMUNITY)
Admission: RE | Admit: 2023-09-28 | Discharge: 2023-09-28 | Disposition: A | Discharge status: Home / Self Care | Source: Ambulatory Visit | Attending: Physician Assistant | Admitting: Physician Assistant

## 2023-09-28 VITALS — BP 128/74 | HR 77 | Ht 63.0 in | Wt 171.4 lb

## 2023-09-28 DIAGNOSIS — D6869 Other thrombophilia: Secondary | ICD-10-CM | POA: Diagnosis not present

## 2023-09-28 DIAGNOSIS — I4819 Other persistent atrial fibrillation: Secondary | ICD-10-CM | POA: Diagnosis not present

## 2023-09-28 NOTE — Progress Notes (Signed)
 Primary Care Physician: Hershell Lose, NP Primary Cardiologist: None Electrophysiologist: None  Referring Physician: ED   Lydia Garcia is a 83 y.o. female with a history of CVA and atrial fibrillation who presents for follow up in the [facility].  The patient presented to the ED 06/11/23 with sudden onset aphasia and dizziness and was found to have an acute subcortical infarct along the left temporoparietal junction. ECG showed new onset atrial fibrillation. Patient was started on Eliquis  for stroke prevention and metoprolol  for rate control. Echo showed normal EF 60-65%, moderately elevated pulmonary pressure, moderately dilated LA, mild MR.   Patient returns for follow up for atrial fibrillation. She reports that she has done well since her last visit. She denies any symptoms from her afib. No bleeding issues on anticoagulation.   Today, she  denies symptoms of palpitations, chest pain, shortness of breath, orthopnea, PND, lower extremity edema, dizziness, presyncope, syncope, snoring, daytime somnolence, bleeding, or neurologic sequela. The patient is tolerating medications without difficulties and is otherwise without complaint today.    Atrial Fibrillation Risk Factors:  she does not have symptoms or diagnosis of sleep apnea. she does not have a history of rheumatic fever. she does not have a history of alcohol use.   Atrial Fibrillation Management history:  Previous antiarrhythmic drugs: none Previous cardioversions: none Previous ablations: none Anticoagulation history: Eliquis   ROS- All systems are reviewed and negative except as per the HPI above.  Past Medical History:  Diagnosis Date   Atrial fibrillation (HCC)    CVA (cerebral vascular accident) [facility])     Current Outpatient Medications  Medication Sig Dispense Refill   apixaban  (ELIQUIS ) 5 MG TABS tablet Take 1 tablet (5 mg total) by mouth 2 (two) times daily. 60 tablet 3    cholecalciferol (VITAMIN D3) 25 MCG (1000 UNIT) tablet Take 1,000 Units by mouth daily.     furosemide (LASIX) 20 MG tablet Take 20 mg by mouth daily.     metoprolol  tartrate (LOPRESSOR ) 25 MG tablet Take 0.5 tablets (12.5 mg total) by mouth 2 (two) times daily. 30 tablet 3   Multiple Vitamin (MULTIVITAMIN WITH MINERALS) TABS tablet Take 1 tablet by mouth daily.     No current facility-administered medications for this encounter.    Physical Exam: BP 128/74   Pulse 77   Ht 5\' 3"  (1.6 m)   Wt 77.7 kg   BMI 30.36 kg/m   GEN: Well nourished, well developed in no acute distress CARDIAC: Irregularly irregular rate and rhythm, no murmurs, rubs, gallops RESPIRATORY:  Clear to auscultation without rales, wheezing or rhonchi  ABDOMEN: Soft, non-tender, non-distended EXTREMITIES:  No edema; No deformity    Wt Readings from Last 3 Encounters:  09/28/23 77.7 kg  08/19/23 78 kg  06/24/23 76.7 kg     EKG today demonstrates  Afib, NST Vent. rate 77 BPM PR interval * ms QRS duration 70 ms QT/QTcB 302/341 ms   Echo 06/11/23 demonstrated   1. Left ventricular ejection fraction, by estimation, is 60 to 65%. The  left ventricle has normal function. The left ventricle has no regional  wall motion abnormalities. There is mild concentric left ventricular  hypertrophy. Left ventricular diastolic function could not be evaluated.   2. Right ventricular systolic function is normal. The right ventricular  size is mildly enlarged. There is moderately elevated pulmonary artery  systolic pressure. The estimated right ventricular systolic pressure is  59.1 mmHg.   3. Left atrial size was  mild to moderately dilated.   4. Right atrial size was moderately dilated.   5. The mitral valve is normal in structure. Mild mitral valve  regurgitation. No evidence of mitral stenosis.   6. Tricuspid valve regurgitation is severe.   7. The aortic valve is normal in structure. Aortic valve regurgitation is  not  visualized. No aortic stenosis is present.   8. The inferior vena cava is dilated in size with <50% respiratory  variability, suggesting right atrial pressure of 15 mmHg.    CHA2DS2-VASc Score = 5  The patient's score is based upon: CHF History: 0 HTN History: 0 Diabetes History: 0 Stroke History: 2 Vascular Disease History: 0 Age Score: 2 Gender Score: 1       ASSESSMENT AND PLAN: Persistent Atrial Fibrillation (ICD10:  I48.19) The patient's CHA2DS2-VASc score is 5, indicating a 7.2% annual risk of stroke.   Patient remains in rate controlled afib, asymptomatic. She has opted for a rate control strategy Continue Eliquis  5 mg BID Continue Lopressor  12.5 mg BID  Secondary Hypercoagulable State (ICD10:  D68.69) The patient is at significant risk for stroke/thromboembolism based upon her CHA2DS2-VASc Score of 5.  Continue Apixaban  (Eliquis ). No bleeding issues.     Follow up in the AF clinic in 6 months. If still no issues with afib at that time, will follow up PRN thereafter.       [name] PA-C Afib Clinic [facility] [address] [place], [place] [address] [phone]

## 2023-10-30 ENCOUNTER — Other Ambulatory Visit (HOSPITAL_COMMUNITY): Payer: Self-pay | Admitting: Physician Assistant

## 2024-03-13 ENCOUNTER — Encounter: Payer: Self-pay | Admitting: Neurology

## 2024-03-13 ENCOUNTER — Ambulatory Visit: Admitting: Neurology

## 2024-03-13 VITALS — BP 113/74 | HR 75 | Ht 63.0 in | Wt 168.0 lb

## 2024-03-13 DIAGNOSIS — I4891 Unspecified atrial fibrillation: Secondary | ICD-10-CM

## 2024-03-13 DIAGNOSIS — Z8673 Personal history of transient ischemic attack (TIA), and cerebral infarction without residual deficits: Secondary | ICD-10-CM

## 2024-03-13 NOTE — Progress Notes (Signed)
 [facility] [address] [place]. [place] [address] [phone]       OFFICE FOLLOW-UP VISIT NOTE  [name] Date of Birth:  09-May-1940 Medical Record Number:  [id number]   Referring MD: Jorene Last, NP  Reason for Referral: Stroke  HPI: Initial visit 08/19/2023: [name] is a pleasant 83 year old African-American lady seen today for initial office consultation visit for stroke.  History is obtained from the patient and review of electronic medical records.  I personally reviewed pertinent available imaging films in PACS.  She presented on 06/11/2023 for evaluation for sudden onset of speech difficulties and dizziness.  She was fine the night before and next morning when she woke up and started cleaning her house she started feeling somewhat dizzy.  She called her daughter at 60 AM and apparently her speech was normal.  When the daughter called her back shortly thereafter the patient's speech was gibberish and daughter was not able to understand her.  Patient was found to have mild aphasia on arrival.  Stat CT head was unremarkable for acute abnormalities.  CT angiogram of the head and neck along with perfusion showed no large vessel occlusion or any penumbra.  MRI brain however showed as small left temporoparietal subcortical infarct.  Patient was found to be in new onset atrial fibrillation on EKG monitor.  2D echo showed ejection fraction of 60 to 65% with left atrium mild to moderately dilated.  LDL cholesterol 46 mg percent.  Hemoglobin A1c was 5.2.  Patient was started on Eliquis  speech resolved completely by the time of discharge.  She states she has done well.  She has had no recurring stroke or TIA symptoms.  She is tolerating Eliquis  well without bruising or bleeding.  She has been started on metoprolol  for rate control and has followed up with cardiology clinic.  She has no complaints today.  She denies any prior history of strokes TIA seizures or significant  neurological problems.  She states she has had palpitations in the past but was never diagnosed with A-fib but given left atrial dilatation on echo chances are that her A-fib is likely longstanding though it was diagnosed recently. Update 03/13/2024 : She returns for follow-up after last visit 6 months ago.  She is doing well.  She has not had no recurrent TIA or stroke symptoms.  She remains on Eliquis  which is tolerating well without bruising or bleeding.  She states her blood pressure is under good control today it is 113/74.  She was having leg swelling and her primary care physician recently started her on Lasix which seems to have helped.  She has an upcoming appointment to see primary care physician next month and will have follow-up lipid profile and A1c checked.  She has no new health problems.  She lives at home.  She is independent in all actives of daily living.  She lives alone.  She has no complaints today. ROS:   14 system review of systems is positive for speech difficulties, dizziness all other systems negative  PMH:  Past Medical History:  Diagnosis Date   Atrial fibrillation (HCC)    CVA (cerebral vascular accident) (HCC)     Social History:  Social History   Socioeconomic History   Marital status: Married    Spouse name: Not on file   Number of children: Not on file   Years of education: Not on file   Highest education level: Not on file  Occupational History   Not  on file  Tobacco Use   Smoking status: Never   Smokeless tobacco: Never   Tobacco comments:    Never smoked 06/24/23  Vaping Use   Vaping status: Never Used  Substance and Sexual Activity   Alcohol use: Never   Drug use: Never   Sexual activity: Not on file  Other Topics Concern   Not on file  Social History Narrative   Not on file   Social Drivers of Health   Financial Resource Strain: Not on file  Food Insecurity: No Food Insecurity (06/11/2023)   Hunger Vital Sign    Worried About Running  Out of Food in the Last Year: Never true    Ran Out of Food in the Last Year: Never true  Transportation Needs: No Transportation Needs (06/11/2023)   PRAPARE - Administrator, Civil Service (Medical): No    Lack of Transportation (Non-Medical): No  Physical Activity: Not on file  Stress: Not on file  Social Connections: Moderately Integrated (06/11/2023)   Social Connection and Isolation Panel    Frequency of Communication with Friends and Family: More than three times a week    Frequency of Social Gatherings with Friends and Family: Three times a week    Attends Religious Services: More than 4 times per year    Active Member of Clubs or Organizations: Yes    Attends Banker Meetings: More than 4 times per year    Marital Status: Widowed  Intimate Partner Violence: Not At Risk (06/11/2023)   Humiliation, Afraid, Rape, and Kick questionnaire    Fear of Current or Ex-Partner: No    Emotionally Abused: No    Physically Abused: No    Sexually Abused: No    Medications:   Current Outpatient Medications on File Prior to Visit  Medication Sig Dispense Refill   apixaban  (ELIQUIS ) 5 MG TABS tablet Take 1 tablet (5 mg total) by mouth 2 (two) times daily. 60 tablet 3   cholecalciferol (VITAMIN D3) 25 MCG (1000 UNIT) tablet Take 1,000 Units by mouth daily.     furosemide (LASIX) 20 MG tablet Take 20 mg by mouth daily.     metoprolol  tartrate (LOPRESSOR ) 25 MG tablet TAKE 0.5 TABLETS BY MOUTH 2 TIMES DAILY. 90 tablet 3   Multiple Vitamin (MULTIVITAMIN WITH MINERALS) TABS tablet Take 1 tablet by mouth daily.     potassium chloride (KLOR-CON) 20 MEQ packet Take 20 mEq by mouth daily.     No current facility-administered medications on file prior to visit.    Allergies:  No Known Allergies  Physical Exam General: well developed, well nourished pleasant elderly African-American lady, seated, in no evident distress Head: head normocephalic and atraumatic.   Neck: supple  with no carotid or supraclavicular bruits Cardiovascular: irregular rate and rhythm-atrial fibrillation, no murmurs Musculoskeletal: no deformity Skin:  no rash/petichiae Vascular:  Normal pulses all extremities  Neurologic Exam Mental Status: Awake and fully alert. Oriented to place and time. Recent and remote memory intact. Attention span, concentration and fund of knowledge appropriate. Mood and affect appropriate.  Cranial Nerves: Fundoscopic exam not done.. Pupils equal, briskly reactive to light. Extraocular movements full without nystagmus. Visual fields full to confrontation. Hearing intact. Facial sensation intact. Face, tongue, palate moves normally and symmetrically.  Motor: Normal bulk and tone. Normal strength in all tested extremity muscles. Sensory.: intact to touch , pinprick , position and vibratory sensation.  Coordination: Rapid alternating movements normal in all extremities. Finger-to-nose and heel-to-shin performed  accurately bilaterally. Gait and Station: Arises from chair without difficulty. Stance is normal. Gait demonstrates normal stride length and balance . Able to heel, toe and tandem walk with mild difficulty.  Reflexes: 1+ and symmetric. Toes downgoing.      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  Patient has done well with no residual deficits.  No significant vascular risk factors except atrial fibrillation and age     PLAN:I had a long d/w patient about her recent embolic stroke,new onset atrial fibrillation, risk for recurrent stroke/TIAs, personally independently reviewed imaging studies and stroke evaluation results and answered questions.Continue Eliquis  (apixaban ) 5 mg twice daily  for secondary stroke prevention and maintain strict control of hypertension with blood pressure goal below 130/90, diabetes with hemoglobin A1c goal below 6.5% and lipids with LDL  cholesterol goal below 70 mg/dL. I also advised the patient to eat a healthy diet with plenty of whole grains, cereals, fruits and vegetables, exercise regularly and maintain ideal body weight Followup in the future with me as needed only or call earlier if needed.    I personally spent a total of 35 minutes in the care of the patient today including getting/reviewing separately obtained history, performing a medically appropriate exam/evaluation, counseling and educating, placing orders, referring and communicating with other health care professionals, documenting clinical information in the EHR, independently interpreting results, and coordinating care.        Eather Popp, MD Note: This document was prepared with digital dictation and possible smart phrase technology. Any transcriptional errors that result from this process are unintentional.

## 2024-03-13 NOTE — Patient Instructions (Signed)
 I had a long d/w patient about her recent embolic stroke,new onset atrial fibrillation, risk for recurrent stroke/TIAs, personally independently reviewed imaging studies and stroke evaluation results and answered questions.Continue Eliquis  (apixaban ) 5 mg twice daily  for secondary stroke prevention and maintain strict control of hypertension with blood pressure goal below 130/90, diabetes with hemoglobin A1c goal below 6.5% and lipids with LDL cholesterol goal below 70 mg/dL. I also advised the patient to eat a healthy diet with plenty of whole grains, cereals, fruits and vegetables, exercise regularly and maintain ideal body weight Followup in the future with me only as needed or call earlier if needed.

## 2024-03-21 ENCOUNTER — Ambulatory Visit (HOSPITAL_COMMUNITY)
Admission: RE | Admit: 2024-03-21 | Discharge: 2024-03-21 | Disposition: A | Source: Ambulatory Visit | Attending: Physician Assistant | Admitting: Physician Assistant

## 2024-03-21 VITALS — BP 90/56 | HR 82 | Ht 63.0 in | Wt 169.6 lb

## 2024-03-21 DIAGNOSIS — I4821 Permanent atrial fibrillation: Secondary | ICD-10-CM

## 2024-03-21 DIAGNOSIS — I4891 Unspecified atrial fibrillation: Secondary | ICD-10-CM | POA: Diagnosis not present

## 2024-03-21 DIAGNOSIS — D6869 Other thrombophilia: Secondary | ICD-10-CM

## 2024-03-21 NOTE — Progress Notes (Signed)
 Primary Care Physician: Leontine Cramp, NP Primary Cardiologist: None Electrophysiologist: None  Referring Physician: ED   Lydia Garcia is a 83 y.o. female with a history of CVA and atrial fibrillation who presents for follow up in the [facility].  The patient presented to the ED 06/11/23 with sudden onset aphasia and dizziness and was found to have an acute subcortical infarct along the left temporoparietal junction. ECG showed new onset atrial fibrillation. Patient was started on Eliquis  for stroke prevention and metoprolol  for rate control. Echo showed normal EF 60-65%, moderately elevated pulmonary pressure, moderately dilated LA, mild MR.   Patient returns for follow up for atrial fibrillation. She remains in afib today and feels well. She is unaware of her arrhythmia. She denies any bleeding issues on anticoagulation.   Today, she  denies symptoms of palpitations, chest pain, shortness of breath, orthopnea, PND, lower extremity edema, dizziness, presyncope, syncope, snoring, daytime somnolence, bleeding, or neurologic sequela. The patient is tolerating medications without difficulties and is otherwise without complaint today.    Atrial Fibrillation Risk Factors:  she does not have symptoms or diagnosis of sleep apnea. she does not have a history of rheumatic fever. she does not have a history of alcohol use.   Atrial Fibrillation Management history:  Previous antiarrhythmic drugs: none Previous cardioversions: none Previous ablations: none Anticoagulation history: Eliquis   ROS- All systems are reviewed and negative except as per the HPI above.  Past Medical History:  Diagnosis Date   Atrial fibrillation (HCC)    CVA (cerebral vascular accident) [facility])     Current Outpatient Medications  Medication Sig Dispense Refill   apixaban  (ELIQUIS ) 5 MG TABS tablet Take 1 tablet (5 mg total) by mouth 2 (two) times daily. 60 tablet 3   cholecalciferol  (VITAMIN D3) 25 MCG (1000 UNIT) tablet Take 1,000 Units by mouth daily.     furosemide (LASIX) 20 MG tablet Take 20 mg by mouth daily.     metoprolol  tartrate (LOPRESSOR ) 25 MG tablet TAKE 0.5 TABLETS BY MOUTH 2 TIMES DAILY. 90 tablet 3   Multiple Vitamin (MULTIVITAMIN WITH MINERALS) TABS tablet Take 1 tablet by mouth daily.     potassium chloride (KLOR-CON) 20 MEQ packet Take 20 mEq by mouth daily.     No current facility-administered medications for this encounter.    Physical Exam: BP (!) 90/56   Pulse 82   Ht 5' 3 (1.6 m)   Wt 76.9 kg   BMI 30.04 kg/m   GEN: Well nourished, well developed in no acute distress CARDIAC: Irregularly irregular rate and rhythm, no murmurs, rubs, gallops RESPIRATORY:  Clear to auscultation without rales, wheezing or rhonchi  ABDOMEN: Soft, non-tender, non-distended EXTREMITIES:  No edema; No deformity    Wt Readings from Last 3 Encounters:  03/21/24 76.9 kg  03/13/24 76.2 kg  09/28/23 77.7 kg     EKG Interpretation Date/Time:  Tuesday March 21 2024 08:39:57 EST Ventricular Rate:  82 PR Interval:    QRS Duration:  66 QT Interval:  300 QTC Calculation: 350 R Axis:   209  Text Interpretation: Atrial fibrillation Low voltage QRS Possible Anterolateral infarct , age undetermined Abnormal ECG When compared with ECG of 28-Sep-2023 09:00, No significant change was found Confirmed by Rozalyn Osland ([id number]) on 03/21/2024 8:46:01 AM    Echo 06/11/23 demonstrated   1. Left ventricular ejection fraction, by estimation, is 60 to 65%. The  left ventricle has normal function. The left ventricle has no regional  wall motion abnormalities. There is mild concentric left ventricular  hypertrophy. Left ventricular diastolic function could not be evaluated.   2. Right ventricular systolic function is normal. The right ventricular  size is mildly enlarged. There is moderately elevated pulmonary artery  systolic pressure. The estimated right ventricular  systolic pressure is  59.1 mmHg.   3. Left atrial size was mild to moderately dilated.   4. Right atrial size was moderately dilated.   5. The mitral valve is normal in structure. Mild mitral valve  regurgitation. No evidence of mitral stenosis.   6. Tricuspid valve regurgitation is severe.   7. The aortic valve is normal in structure. Aortic valve regurgitation is  not visualized. No aortic stenosis is present.   8. The inferior vena cava is dilated in size with <50% respiratory  variability, suggesting right atrial pressure of 15 mmHg.    CHA2DS2-VASc Score = 5  The patient's score is based upon: CHF History: 0 HTN History: 0 Diabetes History: 0 Stroke History: 2 Vascular Disease History: 0 Age Score: 2 Gender Score: 1       ASSESSMENT AND PLAN: Permanent Atrial Fibrillation (ICD10:  I48.11) The patient's CHA2DS2-VASc score is 5, indicating a 7.2% annual risk of stroke.   Patient in rate controlled afib, asymptomatic. She has opted for a rate control strategy.  Continue Eliquis  5 mg BID Continue Lopressor  12.5 mg BID  Secondary Hypercoagulable State (ICD10:  D68.69) The patient is at significant risk for stroke/thromboembolism based upon her CHA2DS2-VASc Score of 5.  Continue Apixaban  (Eliquis ). No bleeding issues.    Follow up in the AF clinic as needed.      Daril Kicks PA-C Afib Clinic [facility] [address] [place], [place] [address] [phone]

## 2024-04-03 ENCOUNTER — Other Ambulatory Visit (HOSPITAL_COMMUNITY): Payer: Self-pay
